# Patient Record
Sex: Male | Born: 1965
Health system: Southern US, Community
[De-identification: ages and names within clinical notes are randomized; demographics above are authoritative.]

## PROBLEM LIST (undated history)

## (undated) DIAGNOSIS — I10 Essential (primary) hypertension: Secondary | ICD-10-CM

## (undated) DIAGNOSIS — K6289 Other specified diseases of anus and rectum: Secondary | ICD-10-CM

## (undated) DIAGNOSIS — G4730 Sleep apnea, unspecified: Secondary | ICD-10-CM

## (undated) DIAGNOSIS — N400 Enlarged prostate without lower urinary tract symptoms: Secondary | ICD-10-CM

## (undated) DIAGNOSIS — K921 Melena: Secondary | ICD-10-CM

## (undated) DIAGNOSIS — M1990 Unspecified osteoarthritis, unspecified site: Secondary | ICD-10-CM

## (undated) DIAGNOSIS — K625 Hemorrhage of anus and rectum: Secondary | ICD-10-CM

## (undated) DIAGNOSIS — H538 Other visual disturbances: Secondary | ICD-10-CM

## (undated) DIAGNOSIS — R39198 Other difficulties with micturition: Secondary | ICD-10-CM

## (undated) DIAGNOSIS — E663 Overweight: Secondary | ICD-10-CM

## (undated) DIAGNOSIS — E785 Hyperlipidemia, unspecified: Secondary | ICD-10-CM

## (undated) DIAGNOSIS — M549 Dorsalgia, unspecified: Secondary | ICD-10-CM

## (undated) DIAGNOSIS — R519 Headache, unspecified: Secondary | ICD-10-CM

## (undated) DIAGNOSIS — R51 Headache: Secondary | ICD-10-CM

## (undated) DIAGNOSIS — K469 Unspecified abdominal hernia without obstruction or gangrene: Secondary | ICD-10-CM

## (undated) DIAGNOSIS — G8929 Other chronic pain: Secondary | ICD-10-CM

## (undated) HISTORY — DX: Other visual disturbances: H53.8

## (undated) HISTORY — DX: Unspecified abdominal hernia without obstruction or gangrene: K46.9

## (undated) HISTORY — PX: HERNIA REPAIR: SHX51

## (undated) HISTORY — DX: Headache, unspecified: R51.9

## (undated) HISTORY — DX: Overweight: E66.3

## (undated) HISTORY — PX: BACK SURGERY: SHX140

## (undated) HISTORY — DX: Hyperlipidemia, unspecified: E78.5

## (undated) HISTORY — DX: Other specified diseases of anus and rectum: K62.89

## (undated) HISTORY — DX: Headache: R51

## (undated) HISTORY — DX: Other chronic pain: G89.29

## (undated) HISTORY — DX: Melena: K92.1

## (undated) HISTORY — DX: Essential (primary) hypertension: I10

## (undated) HISTORY — DX: Dorsalgia, unspecified: M54.9

## (undated) HISTORY — DX: Unspecified osteoarthritis, unspecified site: M19.90

## (undated) HISTORY — DX: Other difficulties with micturition: R39.198

## (undated) HISTORY — DX: Hemorrhage of anus and rectum: K62.5

---

## 2001-03-25 ENCOUNTER — Encounter: Payer: Self-pay | Admitting: Emergency Medicine

## 2001-03-25 ENCOUNTER — Emergency Department (HOSPITAL_COMMUNITY): Admission: EM | Admit: 2001-03-25 | Discharge: 2001-03-25 | Payer: Self-pay | Admitting: Emergency Medicine

## 2006-11-20 ENCOUNTER — Ambulatory Visit (HOSPITAL_BASED_OUTPATIENT_CLINIC_OR_DEPARTMENT_OTHER): Admission: RE | Admit: 2006-11-20 | Discharge: 2006-11-21 | Payer: Self-pay | Admitting: General Surgery

## 2009-11-17 ENCOUNTER — Emergency Department (HOSPITAL_BASED_OUTPATIENT_CLINIC_OR_DEPARTMENT_OTHER): Admission: EM | Admit: 2009-11-17 | Discharge: 2009-11-17 | Payer: Self-pay | Admitting: Emergency Medicine

## 2010-09-26 ENCOUNTER — Encounter: Payer: Self-pay | Admitting: Emergency Medicine

## 2010-11-29 LAB — COMPREHENSIVE METABOLIC PANEL
ALT: 23 U/L (ref 0–53)
AST: 28 U/L (ref 0–37)
Albumin: 4.5 g/dL (ref 3.5–5.2)
Alkaline Phosphatase: 118 U/L — ABNORMAL HIGH (ref 39–117)
BUN: 10 mg/dL (ref 6–23)
CO2: 29 mEq/L (ref 19–32)
Calcium: 9.2 mg/dL (ref 8.4–10.5)
Chloride: 106 mEq/L (ref 96–112)
Creatinine, Ser: 0.9 mg/dL (ref 0.4–1.5)
GFR calc Af Amer: >60 mL/min (ref >60)
GFR calc non Af Amer: >60 mL/min (ref >60)
Glucose, Bld: 88 mg/dL (ref 70–99)
Potassium: 3.8 mEq/L (ref 3.5–5.1)
Sodium: 148 mEq/L — ABNORMAL HIGH (ref 135–145)
Total Bilirubin: 0.6 mg/dL (ref 0.3–1.2)
Total Protein: 9 g/dL — ABNORMAL HIGH (ref 6.0–8.3)

## 2010-11-29 LAB — URINE MICROSCOPIC-ADD ON

## 2010-11-29 LAB — URINALYSIS, ROUTINE W REFLEX MICROSCOPIC
Bilirubin Urine: NEGATIVE
Glucose, UA: NEGATIVE mg/dL
Ketones, ur: NEGATIVE mg/dL
Leukocytes, UA: NEGATIVE
Nitrite: NEGATIVE
Protein, ur: NEGATIVE mg/dL
Specific Gravity, Urine: 1.036 — ABNORMAL HIGH (ref 1.005–1.030)
Urobilinogen, UA: 0.2 mg/dL (ref 0.0–1.0)
pH: 5.5 (ref 5.0–8.0)

## 2010-11-29 LAB — DIFFERENTIAL
Basophils Absolute: 0.1 10*3/uL (ref 0.0–0.1)
Basophils Relative: 1 % (ref 0–1)
Eosinophils Absolute: 0.3 10*3/uL (ref 0.0–0.7)
Eosinophils Relative: 2 % (ref 0–5)
Lymphocytes Relative: 32 % (ref 12–46)
Lymphs Abs: 4.9 10*3/uL — ABNORMAL HIGH (ref 0.7–4.0)
Monocytes Absolute: 1.5 10*3/uL — ABNORMAL HIGH (ref 0.1–1.0)
Monocytes Relative: 10 % (ref 3–12)
Neutro Abs: 8.8 10*3/uL — ABNORMAL HIGH (ref 1.7–7.7)
Neutrophils Relative %: 56 % (ref 43–77)

## 2010-11-29 LAB — CBC
HCT: 47.4 % (ref 39.0–52.0)
Hemoglobin: 16.2 g/dL (ref 13.0–17.0)
MCHC: 34.2 g/dL (ref 30.0–36.0)
MCV: 87.5 fL (ref 78.0–100.0)
Platelets: 264 10*3/uL (ref 150–400)
RBC: 5.42 MIL/uL (ref 4.22–5.81)
RDW: 12.5 % (ref 11.5–15.5)
WBC: 15.6 10*3/uL — ABNORMAL HIGH (ref 4.0–10.5)

## 2010-11-29 LAB — LIPASE, BLOOD: Lipase: 46 U/L (ref 23–300)

## 2010-11-29 LAB — SEDIMENTATION RATE: Sed Rate: 8 mm/hr (ref 0–16)

## 2011-01-21 NOTE — Op Note (Signed)
NAMESHADD, DUNSTAN NO.:  [id number]   MEDICAL RECORD NO.:  [id number]          PATIENT TYPE:  AMB   LOCATION:  DSC                          FACILITY:  [facility]   PHYSICIAN:  Cherylynn Ridges, M.D.    DATE OF BIRTH:  01-05-1966   DATE OF PROCEDURE:  11/21/2006  DATE OF DISCHARGE:                               OPERATIVE REPORT   PREOP DIAGNOSIS:  Epigastric double ventral hernia.   POSTOP DIAGNOSIS:  Epigastric double ventral hernia.   PROCEDURE:  Repair of double ventral hernia with mesh.   SURGEON:  Cherylynn Ridges, M.D.   ANESTHESIA:  General endotracheal.   ESTIMATED BLOOD LOSS:  Less than 20 mL.   COMPLICATIONS:  None.   CONDITION:  Stable.   FINDINGS:  The patient had incarcerated ventral hernia between the  umbilicus and the epigastric area with omentum caught in it.   OPERATION:  The patient was taken to the operating room, placed on the  table in the supine position.  After an adequate endotracheal anesthetic  was administered; he was prepped and draped in the usual sterile manner  exposing the midline.   An 8-10 cm long midline incision was made above the umbilicus down to  the subcutaneous tissue.  We used electrocautery dissecting down to the  hernia sac which was then dissected down to its fascial attachments  circumferentially.  The fascial opening measured approximately 6 x 4 cm  in size and we excised the sac.  The omentum was allowed to fall back  into the peritoneal cavity.  We then repaired the fascial defect using  interrupted simple stitches of #1 Novofil.  We reinforced this with an  oval piece of mesh measuring 5 x 4 cm in size attaching it on top of the  previous repair using interrupted #1 Novofil sutures.  We irrigated and  soaked the mesh in antibiotic solution, then we closed in two layers; a  deep subcu layer of 3-0 Vicryl; then the skin was closed using stainless  steel staples.  All counts were correct.  Marcaine was injected  into the  wound approximately 10 mL.      Cherylynn Ridges, M.D.  Electronically Signed    JOW/MEDQ  D:  11/20/2006  T:  11/21/2006  Job:  [id number]

## 2011-03-28 ENCOUNTER — Encounter (INDEPENDENT_AMBULATORY_CARE_PROVIDER_SITE_OTHER): Payer: Self-pay

## 2011-03-29 ENCOUNTER — Ambulatory Visit (INDEPENDENT_AMBULATORY_CARE_PROVIDER_SITE_OTHER): Payer: Managed Care, Other (non HMO) | Admitting: General Surgery

## 2011-03-29 ENCOUNTER — Encounter (INDEPENDENT_AMBULATORY_CARE_PROVIDER_SITE_OTHER): Payer: Self-pay | Admitting: General Surgery

## 2011-03-29 DIAGNOSIS — K649 Unspecified hemorrhoids: Secondary | ICD-10-CM | POA: Insufficient documentation

## 2011-03-29 DIAGNOSIS — K648 Other hemorrhoids: Secondary | ICD-10-CM

## 2011-03-29 MED ORDER — HYDROCORTISONE ACETATE 25 MG RE SUPP: 25.0000 mg | Freq: Two times a day (BID) | RECTAL | Status: DC

## 2011-03-29 MED ORDER — HYDROCORTISONE ACETATE 25 MG RE SUPP: 25.0000 mg | Freq: Two times a day (BID) | RECTAL | Status: AC

## 2011-03-29 NOTE — Progress Notes (Signed)
Jared Lopez is a 45 y.Lopez. male.    Chief Complaint  Patient presents with  . Other    new prob- perirectal mass    HPI HPI The patient is status post ventral hernia repair doing well. He comes in now complaining of some perianal and perirectal pain and discomfort along with the mass.  Past Medical History  Diagnosis Date  . Hypertension   . Over weight   . Chronic back pain   . Hyperlipidemia   . Hernia   . Abdominal pain   . Difficulty urinating   . Arthritis   . Blurred vision     Past Surgical History  Procedure Date  . Hernia repair     Family History  Problem Relation Age of Onset  . Heart disease Mother   . Hypertension Mother     Social History History  Substance Use Topics  . Smoking status: Current Some Day Smoker    Types: Cigars  . Smokeless tobacco: Not on file  . Alcohol Use: 0.0 oz/week    1-2 Cans of beer per week    No Known Allergies  Current Outpatient Prescriptions  Medication Sig Dispense Refill  . HYDROcodone-acetaminophen (VICODIN) 5-500 MG per tablet Ad lib.      . metroNIDAZOLE (FLAGYL) 500 MG tablet Daily.      . verapamil (CALAN-SR) 240 MG CR tablet Take 240 mg by mouth daily.        . cyclobenzaprine (FLEXERIL) 10 MG tablet Take 10 mg by mouth at bedtime as needed.        [name] Take 100 mg by mouth 2 (two) times daily.          Review of Systems Review of Systems  Gastrointestinal: Positive for blood in stool (On tissue).    Physical Exam Physical Exam  Genitourinary: Rectal exam shows external hemorrhoid (700 position with internal) and internal hemorrhoid (700 position).        Blood pressure 178/104, pulse 72, temperature 97.3 F (36.3 C), height 5\' 11"  (1.803 m), weight 263 lb 6.4 oz (119.477 kg).  Assessment/Plan: On examination the patient has internal and external hemorrhoids. No acute surgical treatment as necessary.  Plan: Treat the patient with Anusol-HC suppositories and sitz baths.  OC him back in 3 weeks Jared Lopez,Jared Lopez 03/29/2011, 12:09 PM

## 2011-03-29 NOTE — Progress Notes (Signed)
Addended by: Cherylynn Ridges III on: 03/29/2011 12:31 PM   Modules accepted: Orders

## 2011-04-19 ENCOUNTER — Encounter (INDEPENDENT_AMBULATORY_CARE_PROVIDER_SITE_OTHER): Payer: Managed Care, Other (non HMO) | Admitting: General Surgery

## 2011-05-10 ENCOUNTER — Encounter (INDEPENDENT_AMBULATORY_CARE_PROVIDER_SITE_OTHER): Payer: Self-pay | Admitting: General Surgery

## 2011-05-10 ENCOUNTER — Ambulatory Visit (INDEPENDENT_AMBULATORY_CARE_PROVIDER_SITE_OTHER): Payer: Managed Care, Other (non HMO) | Admitting: General Surgery

## 2011-05-10 DIAGNOSIS — K649 Unspecified hemorrhoids: Secondary | ICD-10-CM

## 2011-05-10 NOTE — Progress Notes (Signed)
HPI The patient comes in today with continuing symptoms of perirectal pain and bleeding  PE On examination today he has a firm external hemorrhoid at approximately the one to 2:00 position with 12:00 being directly posterior. Internally he also has an internal hemorrhoid associated with that same column of vessels.  Studiy review There are no studies to review.  Assessment The patient has continuing hemorrhoids and hemorrhoidal symptoms. This after undergoing several weeks of soft suppository treatment.  The patient is due to have back surgery tomorrow  Plan I will assess the patient for an examination under anesthesia in the perirectal and perianal area along with hemorrhoidectomy after he has recovered from his back surgery. I will see the patient again in the office to discuss with him the details of surgery in about 3 months.

## 2011-08-23 ENCOUNTER — Ambulatory Visit (INDEPENDENT_AMBULATORY_CARE_PROVIDER_SITE_OTHER): Payer: Managed Care, Other (non HMO) | Admitting: General Surgery

## 2011-09-01 ENCOUNTER — Encounter (INDEPENDENT_AMBULATORY_CARE_PROVIDER_SITE_OTHER): Payer: Self-pay | Admitting: General Surgery

## 2011-09-06 HISTORY — PX: CARDIAC CATHETERIZATION: SHX172

## 2012-06-01 ENCOUNTER — Encounter (HOSPITAL_COMMUNITY): Payer: Self-pay

## 2012-06-12 ENCOUNTER — Encounter (HOSPITAL_COMMUNITY)
Admission: RE | Disposition: A | Discharge status: Home / Self Care | Payer: Self-pay | Source: Ambulatory Visit | Attending: Cardiology

## 2012-06-12 ENCOUNTER — Ambulatory Visit (HOSPITAL_COMMUNITY)
Admission: RE | Admit: 2012-06-12 | Discharge: 2012-06-12 | Disposition: A | Discharge status: Home / Self Care | Payer: Managed Care, Other (non HMO) | Source: Ambulatory Visit | Attending: Cardiology | Admitting: Cardiology

## 2012-06-12 DIAGNOSIS — I10 Essential (primary) hypertension: Secondary | ICD-10-CM | POA: Insufficient documentation

## 2012-06-12 DIAGNOSIS — R079 Chest pain, unspecified: Secondary | ICD-10-CM | POA: Insufficient documentation

## 2012-06-12 HISTORY — PX: LEFT HEART CATHETERIZATION WITH CORONARY ANGIOGRAM: SHX5451

## 2012-06-12 SURGERY — LEFT HEART CATHETERIZATION WITH CORONARY ANGIOGRAM
Anesthesia: LOCAL

## 2012-06-12 MED ORDER — HEPARIN (PORCINE) IN NACL 2-0.9 UNIT/ML-% IJ SOLN
INTRAMUSCULAR | Status: AC
  Filled 2012-06-12: qty 1500

## 2012-06-12 MED ORDER — HYDROMORPHONE HCL PF 2 MG/ML IJ SOLN
INTRAMUSCULAR | Status: AC
  Filled 2012-06-12: qty 1

## 2012-06-12 MED ORDER — SODIUM CHLORIDE 0.9 % IV SOLN
INTRAVENOUS | Status: DC
  Administered 2012-06-12: 06:00:00 via INTRAVENOUS

## 2012-06-12 MED ORDER — SODIUM CHLORIDE 0.9 % IV SOLN: 1.0000 mL/kg/h | INTRAVENOUS | Status: DC

## 2012-06-12 MED ORDER — LIDOCAINE HCL (PF) 1 % IJ SOLN
INTRAMUSCULAR | Status: AC
  Filled 2012-06-12: qty 30

## 2012-06-12 MED ORDER — ACETAMINOPHEN 325 MG PO TABS: 650.0000 mg | ORAL_TABLET | ORAL | Status: DC | PRN

## 2012-06-12 MED ORDER — ONDANSETRON HCL 4 MG/2ML IJ SOLN: 4.0000 mg | Freq: Four times a day (QID) | INTRAMUSCULAR | Status: DC | PRN

## 2012-06-12 MED ORDER — VERAPAMIL HCL 2.5 MG/ML IV SOLN
INTRAVENOUS | Status: AC
  Filled 2012-06-12: qty 2

## 2012-06-12 MED ORDER — MIDAZOLAM HCL 2 MG/2ML IJ SOLN
INTRAMUSCULAR | Status: AC
  Filled 2012-06-12: qty 2

## 2012-06-12 MED ORDER — NITROGLYCERIN 0.2 MG/ML ON CALL CATH LAB
INTRAVENOUS | Status: AC
  Filled 2012-06-12: qty 1

## 2012-06-12 NOTE — CV Procedure (Signed)
Procedure performed:  Left heart catheterization including hemodynamic monitoring of the left ventricle, LV gram, selective right and left coronary arteriography.  Indication patient is a 46 year-old man with history of hypertension,  who presents with chest pain. Patient has  had non invasive testing which was abnormal revealing inferior ischemia.  Hence is brought to the cardiac catheterization lab to evaluate her coronary anatomy for definitive diagnosis of CAD.  Hemodynamic data:  Left ventricular pressure was 111/4 with LVEDP of 12 mm mercury. Aortic pressure was 105/75 with a mean of 89 mm mercury. There was no pressure gradient across the aortic valve  Left ventricle: Performed in the RAO projection revealed LVEF of 60%. There was No MR. No Wall motion abnormality.  Right coronary artery: The vessel is smooth, normal,  Dominant. It is a very large caliber vessel. The PD and PL bifurcation is in the proximal segment.  Left main coronary artery is large and normal.  Circumflex coronary artery: A large vessel giving origin to a large obtuse marginal 1.   LAD:  Very large caliber vessel . LAD gives origin to a large diagonal-1.  LAD Normal  Technique: Under sterile precautions using a 6 French right radial  arterial access, a 6 French sheath was introduced into the right radial artery. A 5 [place] Tig 4 catheter was advanced into the ascending aorta selective  right coronary artery cannulated and angiography was performed in multiple views. The catheter was then pulled out of the body over the J-wire, a 5 [place] JL 3.5 plastic catheter was utilized to engage left main coronary artery. Angiography was performed in multiple views. The catheter was pulled back Out of the body over exchange length J-wire.  TIG 4 Catheter was used to perform LV gram which was performed in LAO projection Catheter exchanged out of the body over J-Wire. NO immediate complications noted. Patient tolerated the  procedure well.   Rec: Medical therapy with aggressive risk factor reduction.   Disposition: Will be discharged home today with outpatient follow up.

## 2012-06-12 NOTE — Interval H&P Note (Signed)
History and Physical Interval Note:  06/12/2012 7:45 AM  Jared Lopez  has presented today for surgery, with the diagnosis of cp/hp  The various methods of treatment have been discussed with the patient and family. After consideration of risks, benefits and other options for treatment, the patient has consented to  Procedure(s) (LRB) with comments: LEFT HEART CATHETERIZATION WITH CORONARY ANGIOGRAM (N/A) and possible angioplasty as a surgical intervention .  The patient's history has been reviewed, patient examined, no change in status, stable for surgery.  I have reviewed the patient's chart and labs.  Questions were answered to the patient's satisfaction.     Pamella Pert

## 2012-06-12 NOTE — H&P (Signed)
  Please see paper chart  

## 2014-08-14 ENCOUNTER — Encounter (HOSPITAL_COMMUNITY): Payer: Self-pay | Admitting: Cardiology

## 2016-07-25 ENCOUNTER — Encounter (HOSPITAL_BASED_OUTPATIENT_CLINIC_OR_DEPARTMENT_OTHER): Payer: Self-pay | Admitting: *Deleted

## 2016-07-25 ENCOUNTER — Emergency Department (HOSPITAL_BASED_OUTPATIENT_CLINIC_OR_DEPARTMENT_OTHER): Payer: Managed Care, Other (non HMO)

## 2016-07-25 ENCOUNTER — Emergency Department (HOSPITAL_BASED_OUTPATIENT_CLINIC_OR_DEPARTMENT_OTHER)
Admission: EM | Admit: 2016-07-25 | Discharge: 2016-07-25 | Disposition: A | Discharge status: Home / Self Care | Payer: Managed Care, Other (non HMO) | Attending: Emergency Medicine | Admitting: Emergency Medicine

## 2016-07-25 DIAGNOSIS — F17290 Nicotine dependence, other tobacco product, uncomplicated: Secondary | ICD-10-CM | POA: Insufficient documentation

## 2016-07-25 DIAGNOSIS — I10 Essential (primary) hypertension: Secondary | ICD-10-CM | POA: Diagnosis not present

## 2016-07-25 DIAGNOSIS — R21 Rash and other nonspecific skin eruption: Secondary | ICD-10-CM | POA: Diagnosis present

## 2016-07-25 DIAGNOSIS — Z79899 Other long term (current) drug therapy: Secondary | ICD-10-CM | POA: Insufficient documentation

## 2016-07-25 DIAGNOSIS — B019 Varicella without complication: Secondary | ICD-10-CM | POA: Insufficient documentation

## 2016-07-25 LAB — COMPREHENSIVE METABOLIC PANEL
ALT: 29 U/L (ref 17–63)
AST: 24 U/L (ref 15–41)
Albumin: 4.2 g/dL (ref 3.5–5.0)
Alkaline Phosphatase: 97 U/L (ref 38–126)
Anion gap: 10 (ref 5–15)
BUN: 12 mg/dL (ref 6–20)
CO2: 24 mmol/L (ref 22–32)
Calcium: 9.1 mg/dL (ref 8.9–10.3)
Chloride: 101 mmol/L (ref 101–111)
Creatinine, Ser: 1.06 mg/dL (ref 0.61–1.24)
GFR calc Af Amer: >60 mL/min (ref >60)
GFR calc non Af Amer: >60 mL/min (ref >60)
Glucose, Bld: 128 mg/dL — ABNORMAL HIGH (ref 65–99)
Potassium: 3.5 mmol/L (ref 3.5–5.1)
Sodium: 135 mmol/L (ref 135–145)
Total Bilirubin: 0.6 mg/dL (ref 0.3–1.2)
Total Protein: 8.7 g/dL — ABNORMAL HIGH (ref 6.5–8.1)

## 2016-07-25 LAB — CBC WITH DIFFERENTIAL/PLATELET
Band Neutrophils: 1 %
Basophils Absolute: 0.2 10*3/uL — ABNORMAL HIGH (ref 0.0–0.1)
Basophils Relative: 1 %
Eosinophils Absolute: 0.2 10*3/uL (ref 0.0–0.7)
Eosinophils Relative: 1 %
HCT: 46.2 % (ref 39.0–52.0)
Hemoglobin: 16 g/dL (ref 13.0–17.0)
Lymphocytes Relative: 12 %
Lymphs Abs: 2.1 10*3/uL (ref 0.7–4.0)
MCH: 29.1 pg (ref 26.0–34.0)
MCHC: 34.6 g/dL (ref 30.0–36.0)
MCV: 84 fL (ref 78.0–100.0)
Monocytes Absolute: 1.4 10*3/uL — ABNORMAL HIGH (ref 0.1–1.0)
Monocytes Relative: 8 %
Neutro Abs: 13.7 10*3/uL — ABNORMAL HIGH (ref 1.7–7.7)
Neutrophils Relative %: 77 %
Platelets: 297 10*3/uL (ref 150–400)
RBC: 5.5 MIL/uL (ref 4.22–5.81)
RDW: 13.8 % (ref 11.5–15.5)
WBC: 17.6 10*3/uL — ABNORMAL HIGH (ref 4.0–10.5)

## 2016-07-25 LAB — TROPONIN I: Troponin I: <0.03 ng/mL (ref <0.03)

## 2016-07-25 MED ORDER — FAMOTIDINE 20 MG PO TABS: 20.0000 mg | ORAL_TABLET | Freq: Two times a day (BID) | ORAL | 0 refills | Status: DC

## 2016-07-25 MED ORDER — VALACYCLOVIR HCL 1 G PO TABS: 1000.0000 mg | ORAL_TABLET | Freq: Three times a day (TID) | ORAL | 0 refills | Status: AC

## 2016-07-25 MED ORDER — HYDROXYZINE HCL 25 MG PO TABS: 25.0000 mg | ORAL_TABLET | Freq: Four times a day (QID) | ORAL | 0 refills | Status: DC | PRN

## 2016-07-25 NOTE — Discharge Instructions (Addendum)
Take the Valtrex as prescribed. The rest of your workup was reassuring. Your ultrasound was negative. Follow up with your primary care provider. Return to the ER for new or worsening symptoms.

## 2016-07-25 NOTE — ED Triage Notes (Signed)
Blistery rash all over his body x 4 days.

## 2016-07-25 NOTE — ED Provider Notes (Signed)
[facility]   CSN: [id number] Arrival date & time: 07/25/16  1337     History   Chief Complaint Chief Complaint  Patient presents with  . Rash    HPI Jared Lopez is a 50 y.o. male who presents to the emergency department with a 3-day history of pruritic and painful maculopapular rash. The rash began in his left axilla and spread to both upper extremities followed by the torso and back and finally his genital region and posterior thighs. He does have some of the lesions on his palms which have been more bothersome as he can feel them when the flexes his fingers. Some of the lesions are slightly pruritic while other vesicular lesions are painful. He otherwise "feels fine". He did not have "chicken pox" as a child. He also endorses persistent heartburn and unilateral left lower extremity swelling and calf pain for the past few weeks. This is a new problem for him. He denies any history of ill contacts with similar rash,  fever, chills, nausea/vomiting, chest pain, shortness of breath, history of PE/DVT, prolonged immobilization, or recent surgery.  HPI  Past Medical History:  Diagnosis Date  . Abdominal pain   . Arthritis   . Blood in stool   . Blurred vision   . Chronic back pain   . Constipation   . Difficulty urinating   . Generalized headaches    high blood pressure onset  . Hernia   . Hyperlipidemia   . Hypertension   . Mass of perirectal soft tissue   . Over weight   . Rectal bleeding     Patient Active Problem List   Diagnosis Date Noted  . Hemorrhoids 05/10/2011  . Hemorrhoids 03/29/2011  . Internal hemorrhoids 03/29/2011    Past Surgical History:  Procedure Laterality Date  . HERNIA REPAIR    . LEFT HEART CATHETERIZATION WITH CORONARY ANGIOGRAM N/A 06/12/2012   Procedure: LEFT HEART CATHETERIZATION WITH CORONARY ANGIOGRAM;  Surgeon: Pamella Pert, MD;  Location: [facility] CATH LAB;  Service: Cardiovascular;  Laterality: N/A;        Home Medications    Prior to Admission medications   Medication Sig Start Date End Date Taking? Authorizing Provider  ISOSORBIDE DINITRATE PO Take by mouth.   Yes Historical Provider, MD  predniSONE (DELTASONE) 20 MG tablet Take 20 mg by mouth daily with breakfast.   Yes Historical Provider, MD  Tamsulosin HCl (FLOMAX) 0.4 MG CAPS 0.4 mg daily after supper. 03/29/11  Yes Historical Provider, MD  triamcinolone ointment (KENALOG) 0.1 % Apply 1 application topically 2 (two) times daily.   Yes Historical Provider, MD  verapamil (CALAN-SR) 240 MG CR tablet Take 240 mg by mouth daily.     Yes Historical Provider, MD  hydrochlorothiazide (HYDRODIURIL) 25 MG tablet Take 25 mg by mouth daily.    Historical Provider, MD  HYDROcodone-acetaminophen (VICODIN) 5-500 MG per tablet Take 1 tablet by mouth daily as needed.  03/15/11   Historical Provider, MD  pravastatin (PRAVACHOL) 40 MG tablet Take 40 mg by mouth daily.    Historical Provider, MD  valACYclovir (VALTREX) 1000 MG tablet Take 1 tablet (1,000 mg total) by mouth 3 (three) times daily. 07/25/16 08/01/16  Georgiana Shore, PA-C    Family History Family History  Problem Relation Age of Onset  . Heart disease Mother   . Hypertension Mother     Social History Social History  Substance Use Topics  . Smoking status: Current Some Day Smoker  Types: Cigars  . Smokeless tobacco: Never Used  . Alcohol use 0.0 oz/week    1 - 2 Cans of beer per week     Allergies   Patient has no known allergies.   Review of Systems Review of Systems  Constitutional: Negative for activity change, appetite change, chills, fatigue, fever and unexpected weight change.       Reports one episode of night sweats, awakening with his shirt soaked through on Sunday  HENT: Negative for congestion, ear discharge, ear pain, postnasal drip, rhinorrhea, sinus pain, sore throat, trouble swallowing and voice change.   Eyes: Negative for photophobia, pain,  discharge, redness, itching and visual disturbance.  Respiratory: Negative for apnea, cough, choking, chest tightness, shortness of breath, wheezing and stridor.   Cardiovascular: Negative for chest pain, palpitations and leg swelling.  Gastrointestinal: Negative for abdominal distention, abdominal pain, blood in stool, diarrhea, nausea and vomiting.       Patient reports waking up with heartburn  Genitourinary: Positive for genital sores. Negative for difficulty urinating, dysuria, flank pain and hematuria.       Patient reports some of the lesions now on his genitals  Musculoskeletal: Negative for back pain, gait problem, joint swelling, neck pain and neck stiffness.  Skin: Positive for rash. Negative for color change, pallor and wound.       Patient reports a widespread rash that began at his left axilla and spread to his upper extremities, torso, back, genitals and posterior thighs. Some lesions are vesicular and painful while others are macular papular and pruritic.  Neurological: Negative for dizziness, tremors, seizures, syncope, weakness, light-headedness, numbness and headaches.     Physical Exam Updated Vital Signs BP 130/70 (BP Location: Right Arm)   Pulse 76   Temp 98.1 F (36.7 C) (Oral)   Resp 16   Ht 5\' 11"  (1.803 m)   Wt 127.9 kg   SpO2 98%   BMI 39.33 kg/m   Physical Exam  Constitutional: He is oriented to person, place, and time. He appears well-developed and well-nourished. No distress.  Patient is nontoxic appearing sitting comfortably in chair  HENT:  Head: Normocephalic and atraumatic.  Eyes: Right eye exhibits no discharge. Left eye exhibits no discharge.  Neck: Normal range of motion. Neck supple. No tracheal deviation present.  Cardiovascular: Normal rate, regular rhythm and intact distal pulses.  Exam reveals no gallop.   No murmur heard. Pulmonary/Chest: Effort normal and breath sounds normal. No stridor. No respiratory distress. He has no wheezes. He  has no rales. He exhibits no tenderness.  Abdominal: Soft. Bowel sounds are normal. He exhibits no distension. There is no tenderness.  Musculoskeletal: He exhibits tenderness. He exhibits no deformity.  Tender to palpation of the gastrocnemius muscles. 1+ pitting edema in the left lower extremity. Skin is warm to touch bilaterally with good dorsalis pedis pulses. Patient does not have sensory deficits in lower extremities bilaterally.  Neurological: He is alert and oriented to person, place, and time. No sensory deficit. He exhibits normal muscle tone.  Skin: Skin is warm and dry. Rash noted. He is not diaphoretic. No pallor.  Patient has a widespread maculo-papular vesicular rash in different stages of healing that does include the palms. The rash is located on his axilla and both upper extremities, trunk, back, posterior thighs, groin and genitals.  Psychiatric: He has a normal mood and affect. His behavior is normal.  Nursing note and vitals reviewed.    ED Treatments / Results  Labs (all  labs ordered are listed, but only abnormal results are displayed) Labs Reviewed  COMPREHENSIVE METABOLIC PANEL - Abnormal; Notable for the following:       Result Value   Glucose, Bld 128 (*)    Total Protein 8.7 (*)    All other components within normal limits  CBC WITH DIFFERENTIAL/PLATELET - Abnormal; Notable for the following:    WBC 17.6 (*)    Neutro Abs 13.7 (*)    Monocytes Absolute 1.4 (*)    Basophils Absolute 0.2 (*)    All other components within normal limits  HIV ANTIBODY (ROUTINE TESTING)  RPR  TROPONIN I    EKG  EKG Interpretation None       Radiology Dg Chest 2 View  Result Date: 07/25/2016 CLINICAL DATA:  Chest discomfort, heart burn EXAM: CHEST  2 VIEW COMPARISON:  None. FINDINGS: The heart size and mediastinal contours are within normal limits. Both lungs are clear. The visualized skeletal structures are unremarkable. IMPRESSION: No active cardiopulmonary disease.  Electronically Signed   By: Natasha MeadLiviu  Pop M.D.   On: 07/25/2016 16:09   [place] Venous Img Lower Unilateral Left  Result Date: 07/25/2016 CLINICAL DATA:  Left lower extremity swelling for 1 week EXAM: LEFT LOWER EXTREMITY VENOUS DUPLEX ULTRASOUND TECHNIQUE: Doppler venous assessment of the left lower extremity deep venous system was performed, including characterization of spectral flow, compressibility, and phasicity. COMPARISON:  None. FINDINGS: There is complete compressibility of the left common femoral, femoral, and popliteal veins. Doppler analysis demonstrates respiratory phasicity and augmentation of flow with calf compression. No obvious superficial vein or calf vein thrombosis. Left popliteal fossa Baker's cyst measures 4.2 x 4.9 x 1.3 cm. IMPRESSION: No evidence of left lower extremity. Left popliteal fossa Baker's cyst. Electronically Signed   By: Jolaine ClickArthur  Hoss M.D.   On: 07/25/2016 17:13    Procedures Procedures (including critical care time)  Medications Ordered in ED Medications - No data to display   Initial Impression / Assessment and Plan / ED Course  I have reviewed the triage vital signs and the nursing notes.  Pertinent labs & imaging results that were available during my care of the patient were reviewed by me and considered in my medical decision making (see chart for details).  Clinical Course    50 y/o male with a 3-day history of a widespread mildly pruritic maculopapular vesicular rash in different stages of healing, including painful fluid-filled vesicles on an erythematous base.  Because of his immunization status, macular papular vesicular lesions in various stages of healing and description of onset of symptoms and course, I have a high suspicion for varicella as the etiology of this presentation. Spoke with Dr. Silverio LayYao about the patient who agrees with assessment.  Although patient does not have risk factors for DVT/PE such as prior DVT/PE, prolonged immobilization, recent  surgery, or concerning symptoms, such as tachycardia, hypoxia, shortness of breath, chest pain. This new onset of unilateral lower extremity swelling and calf pain combined with a vague description of chest discomfort as heartburn prompted me to order a lower extremity ultrasound Doppler, EKG, and chest x-ray.  X-ray was negative for cardiopulmonary process, ultrasound Doppler was negative for DVT but did show a left popliteal cyst "Baker's cyst". This further lowers my suspicion for a PE.  Patient was also seen by supervising PA, Trixie DredgeEmily West, who also examined the patient and agrees with assessment and plan.  Signed off patient to Noelle PennerSerena Sam PA-C at end of shift with RPR/HIV, troponin, EKG pending.  Anticipate discharge home with valacyclovir assuming otherwise negative results.   Final Clinical Impressions(s) / ED Diagnoses   Final diagnoses:  Varicella without complication    New Prescriptions New Prescriptions   VALACYCLOVIR (VALTREX) 1000 MG TABLET    Take 1 tablet (1,000 mg total) by mouth 3 (three) times daily.     Georgiana Shore, PA-C 07/25/16 1749    Charlynne Pander, MD 07/26/16 [phone]

## 2016-07-25 NOTE — ED Provider Notes (Signed)
Care assumed from Antony MaduraJ. Mitchell, PA-C at end of shift. In brief Jared Lopez is an 50 y.o. male presenting with 3 days of diffuse rash that is consistent with chicken pox. He has no history of chicken pox before. Plan is to treat him with course of Valtrex.  During evaluation pt also mentioned intermittent heartburn symptoms as well as left leg pain and swelling. His chest pain is intermittent and nonexertional. Suspicion for ACS is low. Labs thus far unremarkable. At this time troponin is pending. Pt also mentioned atraumatic left leg pain/swelling for several weeks. No risk factors for DVT. Doppler study is pending. Pending negative troponin and doppler plan is to d/c home with PCP follow up.   Physical Exam  BP 130/70 (BP Location: Right Arm)   Pulse 76   Temp 98.1 F (36.7 C) (Oral)   Resp 16   Ht 5\' 11"  (1.803 m)   Wt 127.9 kg   SpO2 98%   BMI 39.33 kg/m   Physical Exam  Constitutional: He is oriented to person, place, and time. No distress.  HENT:  Head: Atraumatic.  Right Ear: External ear normal.  Left Ear: External ear normal.  Nose: Nose normal.  Eyes: Conjunctivae are normal. No scleral icterus.  Cardiovascular: Normal rate and regular rhythm.   Pulmonary/Chest: Effort normal. No respiratory distress.  Abdominal: He exhibits no distension.  Neurological: He is alert and oriented to person, place, and time.  Skin: Skin is warm and dry. He is not diaphoretic.  Vesiculopapular rash across bilateral extremities, torso, and thighs. Some are tender. Some excoriation marks.  Psychiatric: He has a normal mood and affect. His behavior is normal.  Nursing note and vitals reviewed.      ED Course  Procedures Results for orders placed or performed during the hospital encounter of 07/25/16  Comprehensive metabolic panel  Result Value Ref Range   Sodium 135 135 - 145 mmol/L   Potassium 3.5 3.5 - 5.1 mmol/L   Chloride 101 101 - 111 mmol/L   CO2 24 22 - 32 mmol/L   Glucose,  Bld 128 (H) 65 - 99 mg/dL   BUN 12 6 - 20 mg/dL   Creatinine, Ser 4.091.06 0.61 - 1.24 mg/dL   Calcium 9.1 8.9 - 81.110.3 mg/dL   Total Protein 8.7 (H) 6.5 - 8.1 g/dL   Albumin 4.2 3.5 - 5.0 g/dL   AST 24 15 - 41 U/L   ALT 29 17 - 63 U/L   Alkaline Phosphatase 97 38 - 126 U/L   Total Bilirubin 0.6 0.3 - 1.2 mg/dL   GFR calc non Af Amer >60 >60 mL/min   GFR calc Af Amer >60 >60 mL/min   Anion gap 10 5 - 15  CBC with Differential  Result Value Ref Range   WBC 17.6 (H) 4.0 - 10.5 K/uL   RBC 5.50 4.22 - 5.81 MIL/uL   Hemoglobin 16.0 13.0 - 17.0 g/dL   HCT 91.446.2 78.239.0 - 95.652.0 %   MCV 84.0 78.0 - 100.0 fL   MCH 29.1 26.0 - 34.0 pg   MCHC 34.6 30.0 - 36.0 g/dL   RDW 21.313.8 08.611.5 - 57.815.5 %   Platelets 297 150 - 400 K/uL   Neutrophils Relative % 77 %   Lymphocytes Relative 12 %   Monocytes Relative 8 %   Eosinophils Relative 1 %   Basophils Relative 1 %   Band Neutrophils 1 %   Neutro Abs 13.7 (H) 1.7 - 7.7 K/uL  Lymphs Abs 2.1 0.7 - 4.0 K/uL   Monocytes Absolute 1.4 (H) 0.1 - 1.0 K/uL   Eosinophils Absolute 0.2 0.0 - 0.7 K/uL   Basophils Absolute 0.2 (H) 0.0 - 0.1 K/uL   WBC Morphology WHITE COUNT CONFIRMED ON SMEAR    Smear Review LARGE PLATELETS PRESENT   Troponin I  Result Value Ref Range   Troponin I <0.03 <0.03 ng/mL   Dg Chest 2 View  Result Date: 07/25/2016 CLINICAL DATA:  Chest discomfort, heart burn EXAM: CHEST  2 VIEW COMPARISON:  None. FINDINGS: The heart size and mediastinal contours are within normal limits. Both lungs are clear. The visualized skeletal structures are unremarkable. IMPRESSION: No active cardiopulmonary disease. Electronically Signed   By: Natasha MeadLiviu  Pop M.D.   On: 07/25/2016 16:09   [place] Venous Img Lower Unilateral Left  Result Date: 07/25/2016 CLINICAL DATA:  Left lower extremity swelling for 1 week EXAM: LEFT LOWER EXTREMITY VENOUS DUPLEX ULTRASOUND TECHNIQUE: Doppler venous assessment of the left lower extremity deep venous system was performed, including  characterization of spectral flow, compressibility, and phasicity. COMPARISON:  None. FINDINGS: There is complete compressibility of the left common femoral, femoral, and popliteal veins. Doppler analysis demonstrates respiratory phasicity and augmentation of flow with calf compression. No obvious superficial vein or calf vein thrombosis. Left popliteal fossa Baker's cyst measures 4.2 x 4.9 x 1.3 cm. IMPRESSION: No evidence of left lower extremity. Left popliteal fossa Baker's cyst. Electronically Signed   By: Jolaine ClickArthur  Hoss M.D.   On: 07/25/2016 17:13      MDM Doppler negative. Troponin negative. Discussed findings with pt. Rx for valtrex given per prior team. I have also started pt on Pepcid as well as given rx for vistaril to help with itching. Instructed close f/u with PCP. ER return precautions given.       Carlene CoriaSerena Y Rondle Lohse, PA-C 07/26/16 0111    Arby BarretteMarcy Pfeiffer, MD 07/28/16 [phone]

## 2016-07-26 LAB — RPR: RPR Ser Ql: REACTIVE — AB

## 2016-07-26 LAB — RPR, QUANT+TP ABS (REFLEX)
Rapid Plasma Reagin, Quant: 1:1 {titer} — ABNORMAL HIGH
T Pallidum Abs: POSITIVE — AB

## 2016-07-26 LAB — HIV ANTIBODY (ROUTINE TESTING W REFLEX): HIV Screen 4th Generation wRfx: NONREACTIVE

## 2016-07-29 ENCOUNTER — Emergency Department (HOSPITAL_BASED_OUTPATIENT_CLINIC_OR_DEPARTMENT_OTHER)
Admission: EM | Admit: 2016-07-29 | Discharge: 2016-07-29 | Disposition: A | Discharge status: Home / Self Care | Payer: Managed Care, Other (non HMO) | Attending: Emergency Medicine | Admitting: Emergency Medicine

## 2016-07-29 ENCOUNTER — Encounter (HOSPITAL_BASED_OUTPATIENT_CLINIC_OR_DEPARTMENT_OTHER): Payer: Self-pay | Admitting: *Deleted

## 2016-07-29 DIAGNOSIS — I10 Essential (primary) hypertension: Secondary | ICD-10-CM | POA: Diagnosis not present

## 2016-07-29 DIAGNOSIS — Z79899 Other long term (current) drug therapy: Secondary | ICD-10-CM | POA: Diagnosis not present

## 2016-07-29 DIAGNOSIS — F17290 Nicotine dependence, other tobacco product, uncomplicated: Secondary | ICD-10-CM | POA: Insufficient documentation

## 2016-07-29 DIAGNOSIS — A5149 Other secondary syphilitic conditions: Secondary | ICD-10-CM

## 2016-07-29 DIAGNOSIS — A539 Syphilis, unspecified: Secondary | ICD-10-CM | POA: Diagnosis not present

## 2016-07-29 DIAGNOSIS — R21 Rash and other nonspecific skin eruption: Secondary | ICD-10-CM | POA: Diagnosis present

## 2016-07-29 MED ORDER — PENICILLIN G BENZATHINE 1200000 UNIT/2ML IM SUSP
2.4000 10*6.[IU] | Freq: Once | INTRAMUSCULAR | Status: AC
  Administered 2016-07-29: 2.4 10*6.[IU] via INTRAMUSCULAR
  Filled 2016-07-29: qty 4

## 2016-07-29 NOTE — ED Provider Notes (Signed)
[facility] Provider Note   CSN: [id number] Arrival date & time: 07/29/16  1340     History   Chief Complaint Chief Complaint  Patient presents with  . Varicella    HPI Jared Lopez is a 50 y.o. male.  HPI Jared Lopez is a 50 y.o. male presents to emergency department with worsening rash. Patient states he started having rash approximately a week ago. He reports rash started on his hands and feet, now spread to the neck, bilateral axilla, palms and soles of his extremities. He was seen initially by an urgent care and was given steroids. He was seen here 4 days ago, at that time he was thought to have chickenpox. Blood work was obtained at that time as well. Patient was found to have elevated white blood cell count. HIV and RPR was sent. Patient states that his rash is worsening, it is itchy, also painful on his feet. He denies any oral mucosal involvement. He states that he is also having pain and swelling to his joints. He denies any fever. Denies any numbness or weakness in extremities. No headaches. No neck pain or stiffness. No confusion, visual loss, any other concerning symptoms  Past Medical History:  Diagnosis Date  . Abdominal pain   . Arthritis   . Blood in stool   . Blurred vision   . Chronic back pain   . Constipation   . Difficulty urinating   . Generalized headaches    high blood pressure onset  . Hernia   . Hyperlipidemia   . Hypertension   . Mass of perirectal soft tissue   . Over weight   . Rectal bleeding     Patient Active Problem List   Diagnosis Date Noted  . Hemorrhoids 05/10/2011  . Hemorrhoids 03/29/2011  . Internal hemorrhoids 03/29/2011    Past Surgical History:  Procedure Laterality Date  . HERNIA REPAIR    . LEFT HEART CATHETERIZATION WITH CORONARY ANGIOGRAM N/A 06/12/2012   Procedure: LEFT HEART CATHETERIZATION WITH CORONARY ANGIOGRAM;  Surgeon: Pamella PertJagadeesh R Ganji, MD;  [facility];  Service:  Cardiovascular;  Laterality: N/A;       Home Medications    Prior to Admission medications   Medication Sig Start Date End Date Taking? Authorizing Provider  famotidine (PEPCID) 20 MG tablet Take 1 tablet (20 mg total) by mouth 2 (two) times daily. 07/25/16   Ace GinsSerena Y Sam, PA-C  hydrochlorothiazide (HYDRODIURIL) 25 MG tablet Take 25 mg by mouth daily.    Historical Provider, MD  HYDROcodone-acetaminophen (VICODIN) 5-500 MG per tablet Take 1 tablet by mouth daily as needed.  03/15/11   Historical Provider, MD  hydrOXYzine (ATARAX/VISTARIL) 25 MG tablet Take 1 tablet (25 mg total) by mouth every 6 (six) hours as needed. 07/25/16   Ace GinsSerena Y Sam, PA-C  ISOSORBIDE DINITRATE PO Take by mouth.    Historical Provider, MD  pravastatin (PRAVACHOL) 40 MG tablet Take 40 mg by mouth daily.    Historical Provider, MD  predniSONE (DELTASONE) 20 MG tablet Take 20 mg by mouth daily with breakfast.    Historical Provider, MD  Tamsulosin HCl (FLOMAX) 0.4 MG CAPS 0.4 mg daily after supper. 03/29/11   Historical Provider, MD  triamcinolone ointment (KENALOG) 0.1 % Apply 1 application topically 2 (two) times daily.    Historical Provider, MD  valACYclovir (VALTREX) 1000 MG tablet Take 1 tablet (1,000 mg total) by mouth 3 (three) times daily. 07/25/16 08/01/16  Georgiana ShoreJessica B Mitchell, PA-C  verapamil (CALAN-SR) 240 MG CR tablet Take 240 mg by mouth daily.      Historical Provider, MD    Family History Family History  Problem Relation Age of Onset  . Heart disease Mother   . Hypertension Mother     Social History Social History  Substance Use Topics  . Smoking status: Current Some Day Smoker    Types: Cigars  . Smokeless tobacco: Never Used  . Alcohol use 0.0 oz/week    1 - 2 Cans of beer per week     Allergies   Patient has no known allergies.   Review of Systems Review of Systems  Constitutional: Negative for chills and fever.  Respiratory: Negative for cough, chest tightness and shortness of  breath.   Cardiovascular: Negative for chest pain, palpitations and leg swelling.  Gastrointestinal: Negative for abdominal distention, abdominal pain, diarrhea, nausea and vomiting.  Genitourinary: Negative for dysuria, frequency, hematuria and urgency.  Musculoskeletal: Positive for arthralgias, joint swelling and myalgias. Negative for neck pain and neck stiffness.  Skin: Positive for rash.  Allergic/Immunologic: Negative for immunocompromised state.  Neurological: Negative for dizziness, weakness, light-headedness, numbness and headaches.  All other systems reviewed and are negative.    Physical Exam Updated Vital Signs BP 117/77 (BP Location: Left Arm)   Pulse 81   Temp 98.1 F (36.7 C) (Oral)   Resp 22   SpO2 99%   Physical Exam  Constitutional: He appears well-developed and well-nourished. No distress.  HENT:  Head: Normocephalic.  Neck: Normal range of motion. Neck supple.  Cardiovascular: Normal rate, regular rhythm and normal heart sounds.   Pulmonary/Chest: Effort normal and breath sounds normal. No respiratory distress. He has no wheezes. He has no rales.  Abdominal: Soft. There is no tenderness.  Skin: Skin is warm and dry.  Erythematous, raised, papular rash to bilateral hands, wrists, palms of the hands, feet, ankles, soles of the feet, bilateral inguinal areas, bilateral axilla, posterior neck. No lip, or oral involvement.  Nursing note and vitals reviewed.    ED Treatments / Results  Labs (all labs ordered are listed, but only abnormal results are displayed) Labs Reviewed - No data to display  EKG  EKG Interpretation None       Radiology No results found.  Procedures Procedures (including critical care time)  Medications Ordered in ED Medications  penicillin g benzathine (BICILLIN LA) 1200000 UNIT/2ML injection 2.4 Million Units (not administered)     Initial Impression / Assessment and Plan / ED Course  I have reviewed the triage vital  signs and the nursing notes.  Pertinent labs & imaging results that were available during my care of the patient were reviewed by me and considered in my medical decision making (see chart for details).  Clinical Course    Patient is here with worsening rash and diffuse myalgias and arthralgias, patient is under impression he may have chickenpox. Reviewed patient's prior visit. Patient did have blood work done and they did obtain HIV and RPR. Patient HIV is negative. His syphilis workup however showed positive RPR and positive T Pallidum antibodies. This is confirmatory for syphilis. Asked her and to call in to state given it is a reportable disease. Given patient's symptoms, concerning for secondary syphilis. Consulted up-to-date, recommended 2.4 million units of Bicillin LA IM. I discussed results with patient, recommended his wife to be tested as well. We will have patient follow-up with her family doctor to make sure over resolution and not worsening symptoms. All  questions were answered  Vitals:   07/29/16 1508  BP: 117/77  Pulse: 81  Resp: 22  Temp: 98.1 F (36.7 C)  TempSrc: Oral  SpO2: 99%     Final Clinical Impressions(s) / ED Diagnoses   Final diagnoses:  Syphilis, secondary    New Prescriptions New Prescriptions   No medications on file     Jaynie Crumble, PA-C 07/29/16 1625    Jerelyn Scott, MD 07/30/16 [phone]

## 2016-07-29 NOTE — ED Notes (Signed)
ED Provider at bedside. 

## 2016-07-29 NOTE — ED Triage Notes (Signed)
Pt c/o chicken pox DX x 4 days ago , pt c/o severe pain

## 2016-07-29 NOTE — Discharge Instructions (Signed)
Continue tylenol/motrin for pain. Benadryl for itching. Follow up with family doctor. Make sure your wife/sexual partner gets tested.

## 2016-10-03 ENCOUNTER — Other Ambulatory Visit (HOSPITAL_COMMUNITY): Payer: Self-pay | Admitting: Family Medicine

## 2016-10-03 DIAGNOSIS — M7989 Other specified soft tissue disorders: Principal | ICD-10-CM

## 2016-10-03 DIAGNOSIS — M79605 Pain in left leg: Secondary | ICD-10-CM

## 2016-10-04 ENCOUNTER — Ambulatory Visit (HOSPITAL_COMMUNITY)
Admission: RE | Admit: 2016-10-04 | Discharge: 2016-10-04 | Disposition: A | Discharge status: Home / Self Care | Payer: Managed Care, Other (non HMO) | Source: Ambulatory Visit | Attending: Family Medicine | Admitting: Family Medicine

## 2016-10-04 DIAGNOSIS — M79605 Pain in left leg: Secondary | ICD-10-CM

## 2016-10-04 DIAGNOSIS — R2242 Localized swelling, mass and lump, left lower limb: Secondary | ICD-10-CM | POA: Insufficient documentation

## 2016-10-04 DIAGNOSIS — M7989 Other specified soft tissue disorders: Secondary | ICD-10-CM

## 2016-10-04 NOTE — Progress Notes (Signed)
VASCULAR LAB PRELIMINARY  PRELIMINARY  PRELIMINARY  PRELIMINARY  Left lower extremity venous duplex completed.    Preliminary report:  Left:  No evidence of DVT, superficial thrombosis, or Baker's cyst.  Amante Fomby, RVS 10/04/2016, 4:00 PM

## 2016-11-15 ENCOUNTER — Other Ambulatory Visit: Payer: Self-pay | Admitting: Orthopedic Surgery

## 2016-11-17 ENCOUNTER — Ambulatory Visit: Payer: Managed Care, Other (non HMO) | Admitting: Internal Medicine

## 2016-11-21 ENCOUNTER — Encounter (HOSPITAL_BASED_OUTPATIENT_CLINIC_OR_DEPARTMENT_OTHER): Payer: Self-pay | Admitting: *Deleted

## 2016-11-21 NOTE — Progress Notes (Signed)
   11/21/16 1144  OBSTRUCTIVE SLEEP APNEA  Have you ever been diagnosed with sleep apnea through a sleep study? Yes  If yes, do you have and use a CPAP or BPAP machine every night? 1  Do you know the presssure settings on your maching? Yes  Do you snore loudly (loud enough to be heard through closed doors)?  0  Do you often feel tired, fatigued, or sleepy during the daytime (such as falling asleep during driving or talking to someone)? 0  Has anyone observed you stop breathing during your sleep? 0  Do you have, or are you being treated for high blood pressure? 1  BMI more than 35 kg/m2? 1  Age > 50 (1-yes) 0  Neck circumference greater than:Male 16 inches or larger, Male 17inches or larger? 1  Male Gender (Yes=1) 1  Obstructive Sleep Apnea Score 4

## 2016-11-22 ENCOUNTER — Encounter (HOSPITAL_BASED_OUTPATIENT_CLINIC_OR_DEPARTMENT_OTHER)
Admission: RE | Admit: 2016-11-22 | Discharge: 2016-11-22 | Disposition: A | Discharge status: Home / Self Care | Payer: Managed Care, Other (non HMO) | Source: Ambulatory Visit | Attending: Orthopedic Surgery | Admitting: Orthopedic Surgery

## 2016-11-22 DIAGNOSIS — X58XXXA Exposure to other specified factors, initial encounter: Secondary | ICD-10-CM | POA: Diagnosis not present

## 2016-11-22 DIAGNOSIS — R39198 Other difficulties with micturition: Secondary | ICD-10-CM | POA: Diagnosis not present

## 2016-11-22 DIAGNOSIS — G4730 Sleep apnea, unspecified: Secondary | ICD-10-CM | POA: Diagnosis not present

## 2016-11-22 DIAGNOSIS — I10 Essential (primary) hypertension: Secondary | ICD-10-CM | POA: Diagnosis not present

## 2016-11-22 DIAGNOSIS — S83232A Complex tear of medial meniscus, current injury, left knee, initial encounter: Secondary | ICD-10-CM | POA: Diagnosis not present

## 2016-11-22 DIAGNOSIS — Z79899 Other long term (current) drug therapy: Secondary | ICD-10-CM | POA: Diagnosis not present

## 2016-11-22 DIAGNOSIS — S83272A Complex tear of lateral meniscus, current injury, left knee, initial encounter: Secondary | ICD-10-CM | POA: Diagnosis present

## 2016-11-22 DIAGNOSIS — F17290 Nicotine dependence, other tobacco product, uncomplicated: Secondary | ICD-10-CM | POA: Diagnosis not present

## 2016-11-22 DIAGNOSIS — M94262 Chondromalacia, left knee: Secondary | ICD-10-CM | POA: Diagnosis not present

## 2016-11-22 DIAGNOSIS — Z6839 Body mass index (BMI) 39.0-39.9, adult: Secondary | ICD-10-CM | POA: Diagnosis not present

## 2016-11-22 LAB — BASIC METABOLIC PANEL
Anion gap: 7 (ref 5–15)
BUN: 6 mg/dL (ref 6–20)
CO2: 32 mmol/L (ref 22–32)
Calcium: 9.2 mg/dL (ref 8.9–10.3)
Chloride: 101 mmol/L (ref 101–111)
Creatinine, Ser: 1.07 mg/dL (ref 0.61–1.24)
GFR calc Af Amer: >60 mL/min (ref >60)
GFR calc non Af Amer: >60 mL/min (ref >60)
Glucose, Bld: 103 mg/dL — ABNORMAL HIGH (ref 65–99)
Potassium: 3.8 mmol/L (ref 3.5–5.1)
Sodium: 140 mmol/L (ref 135–145)

## 2016-11-22 NOTE — H&P (Signed)
Jared CaroliRonald L Lopez is an 51 y.o. male.    Chief Complaint: Left Knee Pain  HPI: Jared OkaMister Jared Lopez continues have significant pain along the medial side of his left knee.  He's had a 2 prior cortisone injection which helped a great deal, but it is worn off and he is having significant pain with limping.  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  Past Medical History:  Diagnosis Date  . Abdominal pain   . Arthritis   . Blood in stool   . Blurred vision   . Chronic back pain   . Constipation   . Difficulty urinating   . Generalized headaches    high blood pressure onset  . Hernia   . Hyperlipidemia   . Hypertension   . Mass of perirectal soft tissue   . Over weight   . Rectal bleeding   . Sleep apnea    uses CPAP nightly    Past Surgical History:  Procedure Laterality Date  . BACK SURGERY    . CARDIAC CATHETERIZATION  2013   Dr Jacinto HalimGanji  . HERNIA REPAIR    . LEFT HEART CATHETERIZATION WITH CORONARY ANGIOGRAM N/A 06/12/2012   Procedure: LEFT HEART CATHETERIZATION WITH CORONARY ANGIOGRAM;  Surgeon: Pamella PertJagadeesh R Ganji, MD;  [facility];  Service: Cardiovascular;  Laterality: N/A;    Family History  Problem Relation Age of Onset  . Heart disease Mother   . Hypertension Mother    Social History:  reports that he has been smoking Cigars.  He has never used smokeless tobacco. He reports that he drinks alcohol. He reports that he does not use drugs.  Allergies: No Known Allergies  No prescriptions prior to admission.    No results found for this or any previous visit (from the past 48 hour(s)). No results found.  Review of Systems  Constitutional: Negative.   HENT: Positive for hearing loss and tinnitus.   Eyes: Negative.   Respiratory: Negative.   Cardiovascular: Positive for leg swelling.       Enlarged heart, HTN  Gastrointestinal: Positive for heartburn.  Genitourinary:       Weak  stream  Musculoskeletal: Positive for joint pain and myalgias.  Skin: Negative.   Neurological: Negative.   Endo/Heme/Allergies: Negative.   Psychiatric/Behavioral: Negative.     Height 5\' 11"  (1.803 m), weight 131.5 kg (290 lb). Physical Exam  Constitutional: He is oriented to person, place, and time. He appears well-developed and well-nourished.  HENT:  Head: Normocephalic and atraumatic.  Eyes: Pupils are equal, round, and reactive to light.  Neck: Normal range of motion. Neck supple.  Cardiovascular: Intact distal pulses.   Respiratory: Effort normal.  Musculoskeletal: He exhibits tenderness.  Tender along the medial joint line.  The left knee.  1-2+ effusion flexion past 110 exacerbates pain in the knee.    Neurological: He is alert and oriented to person, place, and time.  Skin: Skin is warm and dry.  Psychiatric: He has a normal mood and affect. His behavior is normal. Judgment and thought content normal.     Assessment/Plan Assess: Left knee symptomatic medial meniscal tear, possible lateral meniscal tear, chondromalacia.  Having failed conservative measures over the last few years, including exercise, physical therapy and anti-inflammatory medicines and a couple of cortisone injections.  Plan: Risks and benefits of arthroscopic evaluation treatment of the knee were discussed at length with the patient.  We will  get this set up for him as an outpatient procedure and all see him back at the time of surgical intervention.  He can anticipate being out of work for a few days. since he controls his hours and activities.  If we did this on a Friday.  He should be able to return to work on Monday.  Vickii Volland R, PA-C 11/22/2016, 8:06 AM

## 2016-11-23 ENCOUNTER — Encounter (HOSPITAL_BASED_OUTPATIENT_CLINIC_OR_DEPARTMENT_OTHER)
Admission: RE | Disposition: A | Discharge status: Home / Self Care | Payer: Self-pay | Source: Ambulatory Visit | Attending: Orthopedic Surgery

## 2016-11-23 ENCOUNTER — Ambulatory Visit (HOSPITAL_BASED_OUTPATIENT_CLINIC_OR_DEPARTMENT_OTHER): Payer: Managed Care, Other (non HMO) | Admitting: Anesthesiology

## 2016-11-23 ENCOUNTER — Encounter (HOSPITAL_BASED_OUTPATIENT_CLINIC_OR_DEPARTMENT_OTHER): Payer: Self-pay | Admitting: *Deleted

## 2016-11-23 ENCOUNTER — Ambulatory Visit (HOSPITAL_BASED_OUTPATIENT_CLINIC_OR_DEPARTMENT_OTHER)
Admission: RE | Admit: 2016-11-23 | Discharge: 2016-11-23 | Disposition: A | Discharge status: Home / Self Care | Payer: Managed Care, Other (non HMO) | Source: Ambulatory Visit | Attending: Orthopedic Surgery | Admitting: Orthopedic Surgery

## 2016-11-23 DIAGNOSIS — G4730 Sleep apnea, unspecified: Secondary | ICD-10-CM | POA: Insufficient documentation

## 2016-11-23 DIAGNOSIS — Z6839 Body mass index (BMI) 39.0-39.9, adult: Secondary | ICD-10-CM | POA: Insufficient documentation

## 2016-11-23 DIAGNOSIS — S83272A Complex tear of lateral meniscus, current injury, left knee, initial encounter: Secondary | ICD-10-CM | POA: Diagnosis not present

## 2016-11-23 DIAGNOSIS — Z79899 Other long term (current) drug therapy: Secondary | ICD-10-CM | POA: Insufficient documentation

## 2016-11-23 DIAGNOSIS — X58XXXA Exposure to other specified factors, initial encounter: Secondary | ICD-10-CM | POA: Insufficient documentation

## 2016-11-23 DIAGNOSIS — R39198 Other difficulties with micturition: Secondary | ICD-10-CM | POA: Insufficient documentation

## 2016-11-23 DIAGNOSIS — S83232A Complex tear of medial meniscus, current injury, left knee, initial encounter: Secondary | ICD-10-CM | POA: Insufficient documentation

## 2016-11-23 DIAGNOSIS — F17290 Nicotine dependence, other tobacco product, uncomplicated: Secondary | ICD-10-CM | POA: Insufficient documentation

## 2016-11-23 DIAGNOSIS — M94262 Chondromalacia, left knee: Secondary | ICD-10-CM | POA: Insufficient documentation

## 2016-11-23 DIAGNOSIS — I10 Essential (primary) hypertension: Secondary | ICD-10-CM | POA: Insufficient documentation

## 2016-11-23 HISTORY — PX: KNEE ARTHROSCOPY WITH MEDIAL MENISECTOMY: SHX5651

## 2016-11-23 HISTORY — DX: Sleep apnea, unspecified: G47.30

## 2016-11-23 SURGERY — ARTHROSCOPY, KNEE, WITH MEDIAL MENISCECTOMY
Anesthesia: General | Site: Knee | Laterality: Left

## 2016-11-23 MED ORDER — CEFAZOLIN SODIUM-DEXTROSE 2-4 GM/100ML-% IV SOLN
INTRAVENOUS | Status: AC
  Filled 2016-11-23: qty 100

## 2016-11-23 MED ORDER — PROPOFOL 10 MG/ML IV BOLUS
INTRAVENOUS | Status: AC
  Filled 2016-11-23: qty 20

## 2016-11-23 MED ORDER — DEXAMETHASONE SODIUM PHOSPHATE 10 MG/ML IJ SOLN
INTRAMUSCULAR | Status: AC
  Filled 2016-11-23: qty 1

## 2016-11-23 MED ORDER — FENTANYL CITRATE (PF) 100 MCG/2ML IJ SOLN
INTRAMUSCULAR | Status: AC
  Filled 2016-11-23: qty 2

## 2016-11-23 MED ORDER — HYDROCODONE-ACETAMINOPHEN 5-325 MG PO TABS: 1.0000 | ORAL_TABLET | Freq: Four times a day (QID) | ORAL | 0 refills | Status: DC | PRN

## 2016-11-23 MED ORDER — CEFAZOLIN SODIUM-DEXTROSE 2-4 GM/100ML-% IV SOLN
2.0000 g | INTRAVENOUS | Status: AC
  Administered 2016-11-23: 3 g via INTRAVENOUS

## 2016-11-23 MED ORDER — EPINEPHRINE 30 MG/30ML IJ SOLN
INTRAMUSCULAR | Status: AC
Start: 2016-11-23 — End: 2016-11-23
  Filled 2016-11-23: qty 1

## 2016-11-23 MED ORDER — ONDANSETRON HCL 4 MG/2ML IJ SOLN
INTRAMUSCULAR | Status: AC
  Filled 2016-11-23: qty 2

## 2016-11-23 MED ORDER — PROMETHAZINE HCL 25 MG/ML IJ SOLN: 6.2500 mg | INTRAMUSCULAR | Status: DC | PRN

## 2016-11-23 MED ORDER — FENTANYL CITRATE (PF) 100 MCG/2ML IJ SOLN: 50.0000 ug | INTRAMUSCULAR | Status: DC | PRN

## 2016-11-23 MED ORDER — PROPOFOL 10 MG/ML IV BOLUS
INTRAVENOUS | Status: DC | PRN
  Administered 2016-11-23: 200 mg via INTRAVENOUS

## 2016-11-23 MED ORDER — LIDOCAINE HCL (CARDIAC) 20 MG/ML IV SOLN
INTRAVENOUS | Status: DC | PRN
  Administered 2016-11-23: 30 mg via INTRAVENOUS

## 2016-11-23 MED ORDER — CHLORHEXIDINE GLUCONATE 4 % EX LIQD: 60.0000 mL | Freq: Once | CUTANEOUS | Status: DC

## 2016-11-23 MED ORDER — CEFAZOLIN IN D5W 1 GM/50ML IV SOLN
INTRAVENOUS | Status: AC
  Filled 2016-11-23: qty 50

## 2016-11-23 MED ORDER — LIDOCAINE 2% (20 MG/ML) 5 ML SYRINGE
INTRAMUSCULAR | Status: AC
  Filled 2016-11-23: qty 5

## 2016-11-23 MED ORDER — HYDROMORPHONE HCL 1 MG/ML IJ SOLN
INTRAMUSCULAR | Status: AC
  Filled 2016-11-23: qty 1

## 2016-11-23 MED ORDER — DEXTROSE-NACL 5-0.45 % IV SOLN: INTRAVENOUS | Status: DC

## 2016-11-23 MED ORDER — DEXAMETHASONE SODIUM PHOSPHATE 4 MG/ML IJ SOLN
INTRAMUSCULAR | Status: DC | PRN
  Administered 2016-11-23: 10 mg via INTRAVENOUS

## 2016-11-23 MED ORDER — EPINEPHRINE PF 1 MG/ML IJ SOLN
INTRAMUSCULAR | Status: DC | PRN
  Administered 2016-11-23: 3000 mL

## 2016-11-23 MED ORDER — ONDANSETRON HCL 4 MG/2ML IJ SOLN
INTRAMUSCULAR | Status: DC | PRN
  Administered 2016-11-23: 4 mg via INTRAVENOUS

## 2016-11-23 MED ORDER — FENTANYL CITRATE (PF) 100 MCG/2ML IJ SOLN
INTRAMUSCULAR | Status: DC | PRN
  Administered 2016-11-23: 100 ug via INTRAVENOUS
  Administered 2016-11-23: 25 ug via INTRAVENOUS

## 2016-11-23 MED ORDER — BUPIVACAINE-EPINEPHRINE (PF) 0.25% -1:200000 IJ SOLN
INTRAMUSCULAR | Status: DC | PRN
  Administered 2016-11-23: 20 mL via PERINEURAL

## 2016-11-23 MED ORDER — MIDAZOLAM HCL 2 MG/2ML IJ SOLN: 1.0000 mg | INTRAMUSCULAR | Status: DC | PRN

## 2016-11-23 MED ORDER — MIDAZOLAM HCL 2 MG/2ML IJ SOLN
INTRAMUSCULAR | Status: AC
  Filled 2016-11-23: qty 2

## 2016-11-23 MED ORDER — HYDROMORPHONE HCL 1 MG/ML IJ SOLN
0.2500 mg | INTRAMUSCULAR | Status: DC | PRN
  Administered 2016-11-23 (×2): 0.5 mg via INTRAVENOUS

## 2016-11-23 MED ORDER — BUPIVACAINE HCL (PF) 0.5 % IJ SOLN
INTRAMUSCULAR | Status: AC
  Filled 2016-11-23: qty 30

## 2016-11-23 MED ORDER — LACTATED RINGERS IV SOLN
INTRAVENOUS | Status: DC
  Administered 2016-11-23 (×2): via INTRAVENOUS

## 2016-11-23 MED ORDER — SCOPOLAMINE 1 MG/3DAYS TD PT72: 1.0000 | MEDICATED_PATCH | Freq: Once | TRANSDERMAL | Status: DC | PRN

## 2016-11-23 SURGICAL SUPPLY — 39 items
BANDAGE ACE 4X5 VEL STRL LF (GAUZE/BANDAGES/DRESSINGS) ×2 IMPLANT
BANDAGE ACE 6X5 VEL STRL LF (GAUZE/BANDAGES/DRESSINGS) ×3 IMPLANT
BLADE 4.2CUDA (BLADE) IMPLANT
BLADE CUTTER GATOR 3.5 (BLADE) ×2 IMPLANT
BLADE GREAT WHITE 4.2 (BLADE) ×2 IMPLANT
BNDG COHESIVE 6X5 TAN STRL LF (GAUZE/BANDAGES/DRESSINGS) ×3 IMPLANT
DRAPE ARTHROSCOPY W/POUCH 114 (DRAPES) ×3 IMPLANT
DURAPREP 26ML APPLICATOR (WOUND CARE) ×3 IMPLANT
ELECT MENISCUS 165MM 90D (ELECTRODE) IMPLANT
ELECT REM PT RETURN 9FT ADLT (ELECTROSURGICAL)
ELECTRODE REM PT RTRN 9FT ADLT (ELECTROSURGICAL) IMPLANT
GAUZE SPONGE 4X4 12PLY STRL (GAUZE/BANDAGES/DRESSINGS) ×3 IMPLANT
GAUZE XEROFORM 1X8 LF (GAUZE/BANDAGES/DRESSINGS) ×3 IMPLANT
GLOVE BIO SURGEON STRL SZ7.5 (GLOVE) ×3 IMPLANT
GLOVE BIO SURGEON STRL SZ8.5 (GLOVE) ×3 IMPLANT
GLOVE BIOGEL PI IND STRL 8 (GLOVE) ×2 IMPLANT
GLOVE BIOGEL PI IND STRL 9 (GLOVE) ×2 IMPLANT
GLOVE BIOGEL PI INDICATOR 8 (GLOVE) ×1
GLOVE BIOGEL PI INDICATOR 9 (GLOVE) ×1
GOWN STRL REUS W/ TWL LRG LVL3 (GOWN DISPOSABLE) ×4 IMPLANT
GOWN STRL REUS W/TWL LRG LVL3 (GOWN DISPOSABLE) ×6
GOWN STRL REUS W/TWL XL LVL3 (GOWN DISPOSABLE) ×3 IMPLANT
IV NS IRRIG 3000ML ARTHROMATIC (IV SOLUTION) ×3 IMPLANT
KNEE WRAP E Z 3 GEL PACK (MISCELLANEOUS) ×3 IMPLANT
MANIFOLD NEPTUNE II (INSTRUMENTS) ×2 IMPLANT
NDL SAFETY ECLIPSE 18X1.5 (NEEDLE) ×2 IMPLANT
NEEDLE HYPO 18GX1.5 SHARP (NEEDLE) ×3
PACK ARTHROSCOPY DSU (CUSTOM PROCEDURE TRAY) ×3 IMPLANT
PACK BASIN DAY SURGERY FS (CUSTOM PROCEDURE TRAY) ×3 IMPLANT
PAD ALCOHOL SWAB (MISCELLANEOUS) ×3 IMPLANT
PAD CAST 4YDX4 CTTN HI CHSV (CAST SUPPLIES) ×1 IMPLANT
PADDING CAST COTTON 4X4 STRL (CAST SUPPLIES) ×3
PENCIL BUTTON HOLSTER BLD 10FT (ELECTRODE) IMPLANT
PROBE BIPOLAR ATHRO 135MM 90D (MISCELLANEOUS) IMPLANT
SET ARTHROSCOPY TUBING (MISCELLANEOUS) ×3
SET ARTHROSCOPY TUBING LN (MISCELLANEOUS) ×2 IMPLANT
SYR 3ML 18GX1 1/2 (SYRINGE) IMPLANT
SYR 5ML LL (SYRINGE) ×3 IMPLANT
TOWEL OR 17X24 6PK STRL BLUE (TOWEL DISPOSABLE) ×3 IMPLANT

## 2016-11-23 NOTE — Anesthesia Procedure Notes (Signed)
Procedure Name: LMA Insertion Date/Time: 11/23/2016 12:06 PM Performed by: Genevieve NorlanderLINKA, Kerrin Markman L Pre-anesthesia Checklist: Patient identified, Emergency Drugs available, Suction available, Patient being monitored and Timeout performed Patient Re-evaluated:Patient Re-evaluated prior to inductionOxygen Delivery Method: Circle system utilized Preoxygenation: Pre-oxygenation with 100% oxygen Intubation Type: IV induction Ventilation: Mask ventilation without difficulty LMA: LMA inserted LMA Size: 5.0 Number of attempts: 1 Airway Equipment and Method: Bite block Placement Confirmation: positive ETCO2 Tube secured with: Tape Dental Injury: Teeth and Oropharynx as per pre-operative assessment

## 2016-11-23 NOTE — Op Note (Signed)
Pre-Op Dx: Left knee medial and lateral meniscal tears, chondromalacia  Postop Dx: Same   Procedure: Left knee arthroscopic partial medial and lateral meniscectomies, debridement chondromalacia from the trochlea grade 3 with flap tears  Surgeon: Feliberto GottronFrank J. Turner Danielsowan M.D.  Assist: Tomi LikensEric K. Gaylene BrooksPhillips PA-C  (present throughout entire procedure and necessary for timely completion of the procedure) Anes: General LMA  EBL: Minimal  Fluids: 800 cc   Indications: Catching popping and pain in the left knee. Pt has failed conservative treatment with anti-inflammatory medicines, physical therapy, and modified activites but did get good temporarily from an intra-articular cortisone injection. MRI scan shows lateral meniscal tear medial meniscal tear and chondromalacia trochlea Pain has recurred and patient desires elective arthroscopic evaluation and treatment of knee. Risks and benefits of surgery have been discussed and questions answered.  Procedure: Patient identified by arm band and taken to the operating room at the day surgery Center. The appropriate anesthetic monitors were attached, and General LMA anesthesia was induced without difficulty. Lateral post was applied to the table and the lower extremity was prepped and draped in usual sterile fashion from the ankle to the midthigh. Time out procedure was performed. We began the operation by making standard inferior lateral and inferior medial peripatellar portals with a #11 blade allowing introduction of the arthroscope through the inferior lateral portal and the out flow to the inferior medial portal. Pump pressure was set at 100 mmHg and diagnostic arthroscopy  revealed grade 3 chondromalacia of the trochlea with flap tears debrided back to a stable margin with a 3.5 mm Gator sucker shaver. The medial side there is a posterior horn medial meniscal tear of the inner rim degenerative as well as complex in nature this was debrided back to a stable margin articular  cartilage overall was in good shape on the medial side. The anterior cruciate ligament and the PCL were intact. On the lateral side there is complex tearing of the posterior lateral and anterior horns lateral meniscus debrided back to stable margin with a 4.2 gray-white sucker shaver and then finished with a 35 Gator sucker shaver. We also to use a straight biter a small portion of the posterior horn. The knee was irrigated out normal saline solution. A dressing of xerofoam 4 x 4 dressing sponges, web roll and an Ace wrap was applied. The patient was awakened extubated and taken to the recovery without difficulty.    Signed: Nestor LewandowskyFrank J Adilynne Fitzwater, MD

## 2016-11-23 NOTE — Discharge Instructions (Signed)

## 2016-11-23 NOTE — Anesthesia Postprocedure Evaluation (Addendum)
Anesthesia Post Note  Patient: Jared Lopez  Procedure(s) Performed: Procedure(s) (LRB): KNEE ARTHROSCOPY WITH MEDIAL AND LATERAL MENISECTOMY AND DEBRIEDMENT OF CONDROMALACIA  FEMUR (Left)  Patient location during evaluation: PACU Anesthesia Type: General Level of consciousness: sedated Pain management: pain level controlled Vital Signs Assessment: post-procedure vital signs reviewed and stable Respiratory status: spontaneous breathing and respiratory function stable Cardiovascular status: stable Anesthetic complications: no       Last Vitals:  Vitals:   11/23/16 1315 11/23/16 1330  BP: (!) 157/90 (!) 151/92  Pulse: 82 80  Resp: 20 (!) 22  Temp:      Last Pain:  Vitals:   11/23/16 1330  TempSrc:   PainSc: 4                  Xayvion Shirah DANIEL

## 2016-11-23 NOTE — Interval H&P Note (Signed)
History and Physical Interval Note:  11/23/2016 11:47 AM  Jared Lopez  has presented today for surgery, with the diagnosis of LEFT KNEE MEDIAL MENISCAL TEAR  The various methods of treatment have been discussed with the patient and family. After consideration of risks, benefits and other options for treatment, the patient has consented to  Procedure(s): ARTHROSCOPY LEFT KNEE (Left) as a surgical intervention .  The patient's history has been reviewed, patient examined, no change in status, stable for surgery.  I have reviewed the patient's chart and labs.  Questions were answered to the patient's satisfaction.     Nestor LewandowskyOWAN,Alvon Nygaard J

## 2016-11-23 NOTE — Transfer of Care (Signed)
Immediate Anesthesia Transfer of Care Note  Patient: Agustina CaroliRonald L Perezgarcia  Procedure(s) Performed: Procedure(s): KNEE ARTHROSCOPY WITH MEDIAL AND LATERAL MENISECTOMY AND DEBRIEDMENT OF CONDROMALACIA  FEMUR (Left)  Patient Location: PACU  Anesthesia Type:General  Level of Consciousness: sedated  Airway & Oxygen Therapy: Patient Spontanous Breathing and Patient connected to face mask oxygen  Post-op Assessment: Report given to RN and Post -op Vital signs reviewed and stable  Post vital signs: Reviewed and stable  Last Vitals:  Vitals:   11/23/16 1026  BP: 123/74  Pulse: 63  Resp: 18  Temp: 36.8 C    Last Pain:  Vitals:   11/23/16 1026  TempSrc: Oral  PainSc: 6       Patients Stated Pain Goal: 6 (11/23/16 1026)  Complications: No apparent anesthesia complications

## 2016-11-23 NOTE — Anesthesia Preprocedure Evaluation (Signed)
Anesthesia Evaluation  Patient identified by MRN, date of birth, ID band Patient awake    Reviewed: Allergy & Precautions, NPO status , Patient's Chart, lab work & pertinent test results  Airway Mallampati: II  TM Distance: >3 FB Neck ROM: Full    Dental no notable dental hx. (+) Poor Dentition, Dental Advisory Given   Pulmonary sleep apnea and Continuous Positive Airway Pressure Ventilation , Current Smoker,    Pulmonary exam normal        Cardiovascular hypertension, Pt. on medications Normal cardiovascular exam     Neuro/Psych negative neurological ROS  negative psych ROS   GI/Hepatic negative GI ROS, Neg liver ROS,   Endo/Other  negative endocrine ROSMorbid obesity  Renal/GU negative Renal ROS  negative genitourinary   Musculoskeletal negative musculoskeletal ROS (+)   Abdominal   Peds negative pediatric ROS (+)  Hematology negative hematology ROS (+)   Anesthesia Other Findings   Reproductive/Obstetrics negative OB ROS                             Anesthesia Physical Anesthesia Plan  ASA: III  Anesthesia Plan: General   Post-op Pain Management:    Induction: Intravenous  Airway Management Planned: LMA and Oral ETT  Additional Equipment:   Intra-op Plan:   Post-operative Plan: Extubation in OR  Informed Consent: I have reviewed the patients History and Physical, chart, labs and discussed the procedure including the risks, benefits and alternatives for the proposed anesthesia with the patient or authorized representative who has indicated his/her understanding and acceptance.   Dental advisory given  Plan Discussed with:   Anesthesia Plan Comments:         Anesthesia Quick Evaluation

## 2016-11-24 ENCOUNTER — Encounter (HOSPITAL_BASED_OUTPATIENT_CLINIC_OR_DEPARTMENT_OTHER): Payer: Self-pay | Admitting: Orthopedic Surgery

## 2016-11-24 NOTE — Addendum Note (Signed)
Addendum  created 11/24/16 1328 by Lance CoonWesley Neftali Thurow, CRNA   Charge Capture section accepted

## 2017-02-04 NOTE — Addendum Note (Signed)
Addendum  created 02/04/17 1043 by Namrata Dangler, MD   Sign clinical note    

## 2018-02-07 ENCOUNTER — Other Ambulatory Visit: Payer: Self-pay | Admitting: *Deleted

## 2018-02-07 NOTE — Patient Outreach (Signed)
[facility] Care Management  02/07/2018  Jared CaroliRonald L Jungbluth May 18, 1966 [id number]   Subjective: Telephone call to patient's home  [phone] number, spoke with male answering the phone, states Marlin CanaryRonald Mohs is currently not home, left HIPAA compliant message for Mr. Laural BenesJohnson, and requested call back.      Objective: Per KPN (Knowledge Performance Now, point of care tool), [employer] i[employer]aborate, and chart review, patient hospitalized 01/31/18 -02/03/18 [facility] [facility]    Patient also has a history of OSA with CPAP and hypertension.     Assessment: Received [employer] Transition of care referral on 02/06/18.    Transition of care follow up pending patient contact.       Plan: RNCM will send unsuccessful outreach  letter, [facility] pamphlet, will call patient for 2nd telephone outreach attempt, transition of care follow up, and proceed with case closure, within 10 business days if no return call.        Wallice Granville H. Gardiner Barefootooper RN, BSN, CCM [facility] Telephonic CM Phone: [phone] Fax: [phone]

## 2018-02-08 ENCOUNTER — Other Ambulatory Visit: Payer: Self-pay | Admitting: *Deleted

## 2018-02-08 NOTE — Patient Outreach (Addendum)
[facility] Care Management  02/08/2018  Jared Lopez 1965-11-14 [id number]   Subjective: Telephone call to patient's home  [phone] number, spoke with male answering the phone, states Jared Lopez is currently not home, left HIPAA compliant message for Jared Lopez, and requested call back.      Objective: Per KPN (Knowledge Performance Now, point of care tool), [employer] i[employer]aborate, and chart review, patient hospitalized 01/31/18 -02/03/[facility] [facility]    Patient also has a history of OSA with CPAP and hypertension.     Assessment: Received [employer] Transition of care referral on 02/06/18.    Transition of care follow up pending patient contact.       Plan: RNCM has sent unsuccessful outreach  letter, [facility] pamph[facility], will call patient for 3rd telephone outreach attempt, transition of care follow up, and proceed with case closure, within 10 business days if no return call.       Xylina Rhoads H. Gardiner Barefootooper RN, BSN, CCM [facility] Telephonic CM Phone: [phone] Fax: [phone]

## 2018-02-12 ENCOUNTER — Other Ambulatory Visit: Payer: Self-pay | Admitting: *Deleted

## 2018-02-12 NOTE — Patient Outreach (Addendum)
[facility] Care Management  02/12/2018  Jared Lopez 05/09/1966 [id number]   Subjective: Telephone call to patient's mobile number, spoke with patient, and HIPAA verified.  Discussed [facility] [employer] Transition of care follow up, patient voiced understanding, and is in agreement to follow up.   Patient states he is doing well, currently at work, had hospital follow up appointment with primary MD on 02/05/18, and appointment went well.   States primary [facility]  [facility] to follow up on sleep apnea because CPAP machine greater than 52 years old, and it is not working properly.   States he will follow up with primary MD's office tomorrow if he does not receive a follow up call to schedule sleep study per MD's office instructions. Patient states he is able to manage self care and has assistance as needed with activities of daily living / home management.   Patient voices understanding of medical diagnosis and treatment plan.  States he is accessing his [employer] benefits as needed via member services number on back of card or through [email or web address].   Patient states he does not have any education material, transition of care, care coordination, disease management, disease monitoring, transportation, community resource, or pharmacy needs at this time. States he is very appreciative of the follow up and is in agreement to receive [facility] Care [facility] information.     Objective:Per KPN (Knowledge Performance Now, point of care tool), [employer] iCollaborate, and chart review, patient [facility] [facility] Patient also has a history of OSA with CPAP and hypertension.     Assessment: Received [employer] Transition of care referral on 02/06/18.Transition of care follow up completed, no care management needs, and will proceed with case closure.        Plan:RNCM will send patient successful outreach letter, [facility] pamph[facility], and magnet. RNCM will complete case closure due to follow up completed / no care management needs.       Jared Baxendale H. Gardiner Barefootooper RN, BSN, CCM [facility] Care Management [facility] Telephonic CM Phone: [phone] Fax: [phone]

## 2019-10-02 ENCOUNTER — Other Ambulatory Visit: Payer: Self-pay | Admitting: Family Medicine

## 2019-10-02 DIAGNOSIS — R634 Abnormal weight loss: Secondary | ICD-10-CM

## 2019-10-10 ENCOUNTER — Other Ambulatory Visit: Payer: Managed Care, Other (non HMO)

## 2019-12-28 ENCOUNTER — Other Ambulatory Visit: Payer: Self-pay

## 2019-12-28 ENCOUNTER — Emergency Department (HOSPITAL_BASED_OUTPATIENT_CLINIC_OR_DEPARTMENT_OTHER)
Admission: EM | Admit: 2019-12-28 | Discharge: 2019-12-28 | Disposition: A | Discharge status: Home / Self Care | Payer: 59 | Source: Home / Self Care | Attending: Emergency Medicine | Admitting: Emergency Medicine

## 2019-12-28 ENCOUNTER — Emergency Department (HOSPITAL_BASED_OUTPATIENT_CLINIC_OR_DEPARTMENT_OTHER): Payer: 59

## 2019-12-28 ENCOUNTER — Encounter (HOSPITAL_BASED_OUTPATIENT_CLINIC_OR_DEPARTMENT_OTHER): Payer: Self-pay

## 2019-12-28 DIAGNOSIS — J1282 Pneumonia due to coronavirus disease 2019: Secondary | ICD-10-CM | POA: Insufficient documentation

## 2019-12-28 DIAGNOSIS — U071 COVID-19: Secondary | ICD-10-CM

## 2019-12-28 DIAGNOSIS — F17290 Nicotine dependence, other tobacco product, uncomplicated: Secondary | ICD-10-CM | POA: Insufficient documentation

## 2019-12-28 DIAGNOSIS — Z79899 Other long term (current) drug therapy: Secondary | ICD-10-CM | POA: Insufficient documentation

## 2019-12-28 HISTORY — DX: Benign prostatic hyperplasia without lower urinary tract symptoms: N40.0

## 2019-12-28 LAB — CBC WITH DIFFERENTIAL/PLATELET
Abs Immature Granulocytes: 0.1 10*3/uL — ABNORMAL HIGH (ref 0.00–0.07)
Basophils Absolute: 0.1 10*3/uL (ref 0.0–0.1)
Basophils Relative: 0 %
Eosinophils Absolute: 0 10*3/uL (ref 0.0–0.5)
Eosinophils Relative: 0 %
HCT: 44.5 % (ref 39.0–52.0)
Hemoglobin: 15 g/dL (ref 13.0–17.0)
Immature Granulocytes: 1 %
Lymphocytes Relative: 9 %
Lymphs Abs: 1.2 10*3/uL (ref 0.7–4.0)
MCH: 30.9 pg (ref 26.0–34.0)
MCHC: 33.7 g/dL (ref 30.0–36.0)
MCV: 91.8 fL (ref 80.0–100.0)
Monocytes Absolute: 1.3 10*3/uL — ABNORMAL HIGH (ref 0.1–1.0)
Monocytes Relative: 10 %
Neutro Abs: 10 10*3/uL — ABNORMAL HIGH (ref 1.7–7.7)
Neutrophils Relative %: 80 %
Platelets: 272 10*3/uL (ref 150–400)
RBC: 4.85 MIL/uL (ref 4.22–5.81)
RDW: 14 % (ref 11.5–15.5)
WBC: 12.6 10*3/uL — ABNORMAL HIGH (ref 4.0–10.5)
nRBC: 0 % (ref 0.0–0.2)

## 2019-12-28 LAB — BASIC METABOLIC PANEL
Anion gap: 10 (ref 5–15)
BUN: 10 mg/dL (ref 6–20)
CO2: 24 mmol/L (ref 22–32)
Calcium: 8.4 mg/dL — ABNORMAL LOW (ref 8.9–10.3)
Chloride: 101 mmol/L (ref 98–111)
Creatinine, Ser: 1.09 mg/dL (ref 0.61–1.24)
GFR calc Af Amer: >60 mL/min (ref >60)
GFR calc non Af Amer: >60 mL/min (ref >60)
Glucose, Bld: 139 mg/dL — ABNORMAL HIGH (ref 70–99)
Potassium: 3.4 mmol/L — ABNORMAL LOW (ref 3.5–5.1)
Sodium: 135 mmol/L (ref 135–145)

## 2019-12-28 LAB — SARS CORONAVIRUS 2 AG (30 MIN TAT): SARS Coronavirus 2 Ag: POSITIVE — AB

## 2019-12-28 MED ORDER — DEXAMETHASONE SODIUM PHOSPHATE 10 MG/ML IJ SOLN
10.0000 mg | Freq: Once | INTRAMUSCULAR | Status: AC
  Administered 2019-12-28: 10 mg via INTRAVENOUS
  Filled 2019-12-28: qty 1

## 2019-12-28 MED ORDER — ACETAMINOPHEN 325 MG PO TABS
650.0000 mg | ORAL_TABLET | Freq: Once | ORAL | Status: AC | PRN
  Administered 2019-12-28: 16:00:00 650 mg via ORAL
  Filled 2019-12-28: qty 2

## 2019-12-28 NOTE — Discharge Instructions (Signed)
Your Covid test today came back positive.  Additionally, your chest x-ray did show signs of pneumonia due to Covid.  Take Tylenol and ibuprofen for fevers and muscle aches.  You will need to quarantine for 2 weeks.  Make sure you are getting plenty of rest and staying hydrated.  Return the emergency department for any worsening difficulty breathing, chest pain, inability eat or drink or any other worsening or concerning symptoms.

## 2019-12-28 NOTE — ED Provider Notes (Signed)
[facility] EMERGENCY DEPARTMENT Provider Note   CSN: [id number] Arrival date & time: 12/28/19  1507     History Chief Complaint  Patient presents with  . Fever    Jared Lopez is a 54 y.o. male who presents for evaluation of fever, fatigue that began today as well as persistent cough that is been ongoing.  He states he has had a cough for about 2 weeks.  He states that he has tried to cough it up but states it is mostly dry.  He has had some episodes where he has coughed up phlegm with small strings of blood.  No gross hemoptysis.  He states that today he woke up and started noticing that he was very hot and took his temperature and noted that he had a fever.  He states he felt like he had no energy and was very fatigued.  He denies any chest pain or cough but does states he feels like he is congested in his chest and nose.  He states he took a Covid test last week because he was getting ready to see an urgent care doctor.  He reports that it was negative but he was not symptomatic at that time.  No known COVID-19 exposure.  No recent travel.  No history of asthma or COPD.  He occasionally smokes cigars but no cigarette smoking.  The history is provided by the patient.       Past Medical History:  Diagnosis Date  . Abdominal pain   . Arthritis   . Blood in stool   . Blurred vision   . Chronic back pain   . Constipation   . Difficulty urinating   . Enlarged prostate   . Generalized headaches    high blood pressure onset  . Hernia   . Hyperlipidemia   . Hypertension   . Mass of perirectal soft tissue   . Over weight   . Rectal bleeding   . Sleep apnea    uses CPAP nightly    Patient Active Problem List   Diagnosis Date Noted  . Hemorrhoids 05/10/2011  . Hemorrhoids 03/29/2011  . Internal hemorrhoids 03/29/2011    Past Surgical History:  Procedure Laterality Date  . BACK SURGERY    . CARDIAC CATHETERIZATION  2013   Dr Jacinto Halim  . HERNIA REPAIR    .  KNEE ARTHROSCOPY WITH MEDIAL MENISECTOMY Left 11/23/2016   Procedure: KNEE ARTHROSCOPY WITH MEDIAL AND LATERAL MENISECTOMY AND DEBRIEDMENT OF CONDROMALACIA  FEMUR;  Surgeon: Gean Birchwood, MD;  Location: [facility];  Service: Orthopedics;  Laterality: Left;  . LEFT HEART CATHETERIZATION WITH CORONARY ANGIOGRAM N/A 06/12/2012   Procedure: LEFT HEART CATHETERIZATION WITH CORONARY ANGIOGRAM;  Surgeon: Pamella Pert, MD;  Location: [facility] CATH LAB;  Service: Cardiovascular;  Laterality: N/A;       Family History  Problem Relation Age of Onset  . Heart disease Mother   . Hypertension Mother     Social History   Tobacco Use  . Smoking status: Current Some Day Smoker    Types: Cigars  . Smokeless tobacco: Never Used  Substance Use Topics  . Alcohol use: Yes    Alcohol/week: 1.0 - 2.0 standard drinks    Types: 1 - 2 Cans of beer per week    Comment: social  . Drug use: No    Home Medications Prior to Admission medications   Medication Sig Start Date End Date Taking? Authorizing Provider  hydrochlorothiazide (HYDRODIURIL) 25  MG tablet Take 25 mg by mouth daily.   Yes [provider]  ISOSORBIDE DINITRATE PO Take 20 mg by mouth.    Yes [provider]  Tamsulosin HCl (FLOMAX) 0.4 MG CAPS 0.4 mg daily after supper. 03/29/11  Yes [provider]  verapamil (CALAN-SR) 240 MG CR tablet Take 240 mg by mouth daily.     Yes [provider]  gabapentin (NEURONTIN) 300 MG capsule Take 600 mg by mouth at bedtime. 11/15/19   [provider]  HYDROcodone-acetaminophen (NORCO) 5-325 MG tablet Take 1-2 tablets by mouth every 6 (six) hours as needed. Patient not taking: Reported on 02/12/2018 11/23/16   Allena Katz, PA-C    Allergies    Patient has no known allergies.  Review of Systems   Review of Systems  Constitutional: Positive for fatigue and fever.  HENT: Positive for congestion.   Respiratory: Positive for cough. Negative for  shortness of breath.   Cardiovascular: Negative for chest pain.  Gastrointestinal: Negative for abdominal pain, nausea and vomiting.  Genitourinary: Negative for dysuria and hematuria.  Neurological: Negative for headaches.  All other systems reviewed and are negative.   Physical Exam Updated Vital Signs BP 112/66   Pulse 83   Temp 98.9 F (37.2 C) (Oral)   Resp (!) 26   SpO2 94% Comment: room air  Physical Exam Vitals and nursing note reviewed.  Constitutional:      Appearance: Normal appearance. He is well-developed.     Comments: Sitting comfortably on examination table  HENT:     Head: Normocephalic and atraumatic.  Eyes:     General: Lids are normal.     Conjunctiva/sclera: Conjunctivae normal.     Pupils: Pupils are equal, round, and reactive to light.  Cardiovascular:     Rate and Rhythm: Normal rate and regular rhythm.     Pulses: Normal pulses.     Heart sounds: Normal heart sounds. No murmur. No friction rub. No gallop.   Pulmonary:     Effort: Pulmonary effort is normal.     Breath sounds: Normal breath sounds.     Comments: Lungs clear to auscultation bilaterally.  Symmetric chest rise.  No wheezing, rales, rhonchi. No evidence of respiratory distress.  Abdominal:     Palpations: Abdomen is soft. Abdomen is not rigid.     Tenderness: There is no abdominal tenderness. There is no guarding.     Comments: Abdomen is soft, non-distended, non-tender. No rigidity, No guarding. No peritoneal signs.  Musculoskeletal:        General: Normal range of motion.     Cervical back: Full passive range of motion without pain.  Skin:    General: Skin is warm and dry.     Capillary Refill: Capillary refill takes less than 2 seconds.  Neurological:     Mental Status: He is alert and oriented to person, place, and time.  Psychiatric:        Speech: Speech normal.     ED Results / Procedures / Treatments   Labs (all labs ordered are listed, but only abnormal results are  displayed) Labs Reviewed  SARS CORONAVIRUS 2 AG (30 MIN TAT) - Abnormal; Notable for the following components:      Result Value   SARS Coronavirus 2 Ag POSITIVE (*)    All other components within normal limits  CBC WITH DIFFERENTIAL/PLATELET - Abnormal; Notable for the following components:   WBC 12.6 (*)    Neutro Abs 10.0 (*)  Monocytes Absolute 1.3 (*)    Abs Immature Granulocytes 0.10 (*)    All other components within normal limits  BASIC METABOLIC PANEL - Abnormal; Notable for the following components:   Potassium 3.4 (*)    Glucose, Bld 139 (*)    Calcium 8.4 (*)    All other components within normal limits    EKG None  Radiology DG Chest Portable 1 View  Result Date: 12/28/2019 CLINICAL DATA:  Congestion, cough, fever EXAM: PORTABLE CHEST 1 VIEW COMPARISON:  01/31/2018 FINDINGS: Single frontal view of the chest demonstrates a stable cardiac silhouette. Interval development of multifocal bilateral airspace disease greatest in the right upper and left lower lung zones. No effusion or pneumothorax. No acute bony abnormality. IMPRESSION: 1. Multifocal bilateral pneumonia. Electronically Signed   By: Randa Ngo M.D.   On: 12/28/2019 16:19    Procedures Procedures (including critical care time)  Medications Ordered in ED Medications  acetaminophen (TYLENOL) tablet 650 mg (650 mg Oral Given 12/28/19 1531)  dexamethasone (DECADRON) injection 10 mg (10 mg Intravenous Given 12/28/19 1809)    ED Course  I have reviewed the triage vital signs and the nursing notes.  Pertinent labs & imaging results that were available during my care of the patient were reviewed by me and considered in my medical decision making (see chart for details).    MDM Rules/Calculators/A&P                      54 year old gentleman who presents for evaluation of cough, fever, fatigue, congestion.  Recent Covid test about a week ago which was negative.  States that he has been having some  persistent cough for the last few weeks but fever, fatigue did not start until today.  No known COVID-19 exposure.  He has not gotten vaccinated yet.  No recent travel.  Initially arrival, he is febrile, slightly tachycardic.  Vitals otherwise stable.  No evidence of respiratory distress.  He is speaking full sentences without any difficulty.  His initial O2 sat was 93% on room air.  He does have some rales noted bilaterally but no evidence of wheezing.  Concern for infectious process such as COVID-19 versus pneumonia.  Plan for chest x-ray.  Patient is Covid positive.  Chest x-ray shows multifocal bilateral pneumonia.  Chest x-ray shows slight leukocytosis of 12.6.  Otherwise unremarkable.  BMP shows potassium of 3.4.  Normal BUN and creatinine.  Patient has been maintaining O2 sats between 90-95.  He ambulated maintained between 90-93%.  Discussed results with patient.  Patient is resting comfortably on bed with no signs of distress.  Encouraged at home supportive care measures.  At this time, patient has no hypoxia requiring oxygen.  Stable for discharge home. At this time, patient exhibits no emergent life-threatening condition that require further evaluation in ED or admission. Patient had ample opportunity for questions and discussion. All patient's questions were answered with full understanding. Strict return precautions discussed. Patient expresses understanding and agreement to plan.   Jared Lopez was evaluated in Emergency Department on 12/28/2019 for the symptoms described in the history of present illness. He was evaluated in the context of the global COVID-19 pandemic, which necessitated consideration that the patient might be at risk for infection with the SARS-CoV-2 virus that causes COVID-19. Institutional protocols and algorithms that pertain to the evaluation of patients at risk for COVID-19 are in a state of rapid change based on information released by regulatory bodies including the  CDC and federal and [employer]. These policies and algorithms were followed during the patient's care in the ED.  Portions of this note were generated with Scientist, clinical (histocompatibility and immunogenetics). Dictation errors may occur despite best attempts at proofreading.   Final Clinical Impression(s) / ED Diagnoses Final diagnoses:  COVID-19  Pneumonia due to COVID-19 virus    Rx / DC Orders ED Discharge Orders    None       Rosana Hoes 12/28/19 2221    Rolan Bucco, MD 12/28/19 2317

## 2019-12-28 NOTE — ED Notes (Signed)
ED Provider at bedside. 

## 2019-12-28 NOTE — ED Triage Notes (Signed)
Pt c/o 102 fever this AM. Pt has associated congested cough, congested nose, fatigue. Symptoms started this AM. Pt had a negtive COVID test last Tuesday "just because," no symptoms at that time.

## 2019-12-28 NOTE — ED Notes (Addendum)
Pt ambulated in room with pulse ox- O2 sats 91-93% on room air. NAD, provider made aware

## 2019-12-28 NOTE — ED Notes (Deleted)
Per Okey Regal in lab, pt is Covid+. RN Joss notified

## 2019-12-29 ENCOUNTER — Other Ambulatory Visit: Payer: Self-pay | Admitting: Physician Assistant

## 2019-12-29 DIAGNOSIS — U071 COVID-19: Secondary | ICD-10-CM

## 2019-12-29 MED ORDER — SODIUM CHLORIDE 0.9 % IV SOLN
Freq: Once | INTRAVENOUS | Status: DC
  Filled 2019-12-29: qty 20

## 2019-12-29 NOTE — Progress Notes (Signed)
  I connected by phone with [name] on 12/29/2019 at 8:05 AM to discuss the potential use of an new treatment for mild to moderate COVID-19 viral infection in non-hospitalized patients.  This patient is a 54 y.o. male that meets the FDA criteria for Emergency Use Authorization of bamlanivimab/etesevimab or casirivimab/imdevimab.  Has a (+) direct SARS-CoV-2 viral test result  Has mild or moderate COVID-19   Is ? 54 years of age and weighs ? 40 kg  Is NOT hospitalized due to COVID-19  Is NOT requiring oxygen therapy or requiring an increase in baseline oxygen flow rate due to COVID-19  Is within 10 days of symptom onset  Has at least one of the high risk factor(s) for progression to severe COVID-19 and/or hospitalization as defined in EUA.  Specific high risk criteria : BMI >/= 35   I have spoken and communicated the following to the patient or parent/caregiver:  1. FDA has authorized the emergency use of bamlanivimab/etesevimab and casirivimab\imdevimab for the treatment of mild to moderate COVID-19 in adults and pediatric patients with positive results of direct SARS-CoV-2 viral testing who are 31 years of age and older weighing at least 40 kg, and who are at high risk for progressing to severe COVID-19 and/or hospitalization.  2. The significant known and potential risks and benefits of bamlanivimab/etesevimab and casirivimab\imdevimab, and the extent to which such potential risks and benefits are unknown.  3. Information on available alternative treatments and the risks and benefits of those alternatives, including clinical trials.  4. Patients treated with bamlanivimab/etesevimab and casirivimab\imdevimab should continue to self-isolate and use infection control measures (e.g., wear mask, isolate, social distance, avoid sharing personal items, clean and disinfect "high touch" surfaces, and frequent handwashing) according to CDC guidelines.   5. The patient or  parent/caregiver has the option to accept or refuse bamlanivimab/etesevimab or casirivimab\imdevimab .  After reviewing this information with the patient, The patient agreed to proceed with receiving the bamlanivimab/etesevimab infusion and will be provided a copy of the Fact sheet prior to receiving the infusion.  Sx onset 4/20. Set up for infusion tomorrow at 9:30am. Directions given.  Cline Crock 12/29/2019 8:05 AM

## 2019-12-30 ENCOUNTER — Ambulatory Visit (HOSPITAL_COMMUNITY)
Admission: RE | Admit: 2019-12-30 | Discharge: 2019-12-30 | Disposition: A | Discharge status: Home / Self Care | Payer: 59 | Source: Ambulatory Visit | Attending: Pulmonary Disease | Admitting: Pulmonary Disease

## 2019-12-30 ENCOUNTER — Other Ambulatory Visit: Payer: Self-pay

## 2019-12-30 ENCOUNTER — Emergency Department (HOSPITAL_COMMUNITY): Payer: 59

## 2019-12-30 ENCOUNTER — Encounter (HOSPITAL_COMMUNITY): Payer: Self-pay | Admitting: *Deleted

## 2019-12-30 ENCOUNTER — Inpatient Hospital Stay (HOSPITAL_COMMUNITY)
Admission: EM | Admit: 2019-12-30 | Discharge: 2020-01-06 | DRG: 177 | Disposition: A | Discharge status: Home / Self Care | Payer: 59 | Attending: Internal Medicine | Admitting: Internal Medicine

## 2019-12-30 ENCOUNTER — Other Ambulatory Visit (HOSPITAL_COMMUNITY): Payer: Self-pay

## 2019-12-30 DIAGNOSIS — J982 Interstitial emphysema: Secondary | ICD-10-CM | POA: Diagnosis present

## 2019-12-30 DIAGNOSIS — R778 Other specified abnormalities of plasma proteins: Secondary | ICD-10-CM

## 2019-12-30 DIAGNOSIS — N400 Enlarged prostate without lower urinary tract symptoms: Secondary | ICD-10-CM | POA: Diagnosis present

## 2019-12-30 DIAGNOSIS — I110 Hypertensive heart disease with heart failure: Secondary | ICD-10-CM | POA: Diagnosis present

## 2019-12-30 DIAGNOSIS — E785 Hyperlipidemia, unspecified: Secondary | ICD-10-CM | POA: Diagnosis present

## 2019-12-30 DIAGNOSIS — M549 Dorsalgia, unspecified: Secondary | ICD-10-CM | POA: Diagnosis present

## 2019-12-30 DIAGNOSIS — I5043 Acute on chronic combined systolic (congestive) and diastolic (congestive) heart failure: Secondary | ICD-10-CM | POA: Diagnosis present

## 2019-12-30 DIAGNOSIS — R0602 Shortness of breath: Secondary | ICD-10-CM

## 2019-12-30 DIAGNOSIS — R748 Abnormal levels of other serum enzymes: Secondary | ICD-10-CM | POA: Diagnosis present

## 2019-12-30 DIAGNOSIS — G8929 Other chronic pain: Secondary | ICD-10-CM | POA: Diagnosis present

## 2019-12-30 DIAGNOSIS — Z79891 Long term (current) use of opiate analgesic: Secondary | ICD-10-CM

## 2019-12-30 DIAGNOSIS — F17290 Nicotine dependence, other tobacco product, uncomplicated: Secondary | ICD-10-CM | POA: Diagnosis present

## 2019-12-30 DIAGNOSIS — J1282 Pneumonia due to coronavirus disease 2019: Secondary | ICD-10-CM | POA: Diagnosis present

## 2019-12-30 DIAGNOSIS — U071 COVID-19: Principal | ICD-10-CM | POA: Diagnosis present

## 2019-12-30 DIAGNOSIS — M7989 Other specified soft tissue disorders: Secondary | ICD-10-CM | POA: Diagnosis present

## 2019-12-30 DIAGNOSIS — I4891 Unspecified atrial fibrillation: Secondary | ICD-10-CM | POA: Diagnosis present

## 2019-12-30 DIAGNOSIS — I472 Ventricular tachycardia: Secondary | ICD-10-CM | POA: Diagnosis present

## 2019-12-30 DIAGNOSIS — M1990 Unspecified osteoarthritis, unspecified site: Secondary | ICD-10-CM | POA: Diagnosis present

## 2019-12-30 DIAGNOSIS — Z8249 Family history of ischemic heart disease and other diseases of the circulatory system: Secondary | ICD-10-CM

## 2019-12-30 DIAGNOSIS — I480 Paroxysmal atrial fibrillation: Secondary | ICD-10-CM | POA: Diagnosis present

## 2019-12-30 DIAGNOSIS — Z6839 Body mass index (BMI) 39.0-39.9, adult: Secondary | ICD-10-CM

## 2019-12-30 DIAGNOSIS — I10 Essential (primary) hypertension: Secondary | ICD-10-CM | POA: Diagnosis present

## 2019-12-30 DIAGNOSIS — Z79899 Other long term (current) drug therapy: Secondary | ICD-10-CM | POA: Diagnosis not present

## 2019-12-30 DIAGNOSIS — G4733 Obstructive sleep apnea (adult) (pediatric): Secondary | ICD-10-CM | POA: Diagnosis present

## 2019-12-30 DIAGNOSIS — J9601 Acute respiratory failure with hypoxia: Secondary | ICD-10-CM | POA: Diagnosis present

## 2019-12-30 DIAGNOSIS — E669 Obesity, unspecified: Secondary | ICD-10-CM | POA: Diagnosis present

## 2019-12-30 LAB — ABO/RH: ABO/RH(D): O POS

## 2019-12-30 LAB — CBC WITH DIFFERENTIAL/PLATELET
Abs Immature Granulocytes: 0.07 10*3/uL (ref 0.00–0.07)
Basophils Absolute: 0 10*3/uL (ref 0.0–0.1)
Basophils Relative: 0 %
Eosinophils Absolute: 0 10*3/uL (ref 0.0–0.5)
Eosinophils Relative: 0 %
HCT: 43.6 % (ref 39.0–52.0)
Hemoglobin: 14.3 g/dL (ref 13.0–17.0)
Immature Granulocytes: 1 %
Lymphocytes Relative: 15 %
Lymphs Abs: 2 10*3/uL (ref 0.7–4.0)
MCH: 30.4 pg (ref 26.0–34.0)
MCHC: 32.8 g/dL (ref 30.0–36.0)
MCV: 92.8 fL (ref 80.0–100.0)
Monocytes Absolute: 1 10*3/uL (ref 0.1–1.0)
Monocytes Relative: 7 %
Neutro Abs: 10.7 10*3/uL — ABNORMAL HIGH (ref 1.7–7.7)
Neutrophils Relative %: 77 %
Platelets: 297 10*3/uL (ref 150–400)
RBC: 4.7 MIL/uL (ref 4.22–5.81)
RDW: 13.7 % (ref 11.5–15.5)
WBC: 13.7 10*3/uL — ABNORMAL HIGH (ref 4.0–10.5)
nRBC: 0 % (ref 0.0–0.2)

## 2019-12-30 LAB — COMPREHENSIVE METABOLIC PANEL
ALT: 50 U/L — ABNORMAL HIGH (ref 0–44)
AST: 47 U/L — ABNORMAL HIGH (ref 15–41)
Albumin: 2.7 g/dL — ABNORMAL LOW (ref 3.5–5.0)
Alkaline Phosphatase: 50 U/L (ref 38–126)
Anion gap: 11 (ref 5–15)
BUN: 9 mg/dL (ref 6–20)
CO2: 26 mmol/L (ref 22–32)
Calcium: 7.9 mg/dL — ABNORMAL LOW (ref 8.9–10.3)
Chloride: 102 mmol/L (ref 98–111)
Creatinine, Ser: 1.23 mg/dL (ref 0.61–1.24)
GFR calc Af Amer: >60 mL/min (ref >60)
GFR calc non Af Amer: >60 mL/min (ref >60)
Glucose, Bld: 108 mg/dL — ABNORMAL HIGH (ref 70–99)
Potassium: 3.5 mmol/L (ref 3.5–5.1)
Sodium: 139 mmol/L (ref 135–145)
Total Bilirubin: 1.2 mg/dL (ref 0.3–1.2)
Total Protein: 6 g/dL — ABNORMAL LOW (ref 6.5–8.1)

## 2019-12-30 LAB — LACTATE DEHYDROGENASE: LDH: 287 U/L — ABNORMAL HIGH (ref 98–192)

## 2019-12-30 LAB — HIV ANTIBODY (ROUTINE TESTING W REFLEX): HIV Screen 4th Generation wRfx: NONREACTIVE

## 2019-12-30 LAB — MAGNESIUM: Magnesium: 1.8 mg/dL (ref 1.7–2.4)

## 2019-12-30 LAB — FERRITIN: Ferritin: 696 ng/mL — ABNORMAL HIGH (ref 24–336)

## 2019-12-30 LAB — PROCALCITONIN: Procalcitonin: <0.1 ng/mL

## 2019-12-30 LAB — HEPATITIS B SURFACE ANTIGEN: Hepatitis B Surface Ag: NONREACTIVE

## 2019-12-30 LAB — FIBRINOGEN: Fibrinogen: 605 mg/dL — ABNORMAL HIGH (ref 210–475)

## 2019-12-30 LAB — BRAIN NATRIURETIC PEPTIDE: B Natriuretic Peptide: 48.3 pg/mL (ref 0.0–100.0)

## 2019-12-30 LAB — D-DIMER, QUANTITATIVE
D-Dimer, Quant: 0.66 ug/mL-FEU — ABNORMAL HIGH (ref 0.00–0.50)
D-Dimer, Quant: 0.6 ug/mL-FEU — ABNORMAL HIGH (ref 0.00–0.50)

## 2019-12-30 LAB — TROPONIN I (HIGH SENSITIVITY): Troponin I (High Sensitivity): 24 ng/L — ABNORMAL HIGH (ref <18)

## 2019-12-30 LAB — TSH: TSH: 1.417 u[IU]/mL (ref 0.350–4.500)

## 2019-12-30 MED ORDER — ASCORBIC ACID 500 MG PO TABS
500.0000 mg | ORAL_TABLET | Freq: Every day | ORAL | Status: DC
  Administered 2019-12-30 – 2020-01-06 (×8): 500 mg via ORAL
  Filled 2019-12-30 (×8): qty 1

## 2019-12-30 MED ORDER — HYDROCOD POLST-CPM POLST ER 10-8 MG/5ML PO SUER: 5.0000 mL | Freq: Two times a day (BID) | ORAL | Status: DC | PRN

## 2019-12-30 MED ORDER — ONDANSETRON HCL 4 MG PO TABS: 4.0000 mg | ORAL_TABLET | Freq: Four times a day (QID) | ORAL | Status: DC | PRN

## 2019-12-30 MED ORDER — SODIUM CHLORIDE 0.9% FLUSH
3.0000 mL | Freq: Two times a day (BID) | INTRAVENOUS | Status: DC
  Administered 2019-12-30 – 2020-01-06 (×15): 3 mL via INTRAVENOUS

## 2019-12-30 MED ORDER — SODIUM CHLORIDE 0.9 % IV SOLN
200.0000 mg | Freq: Once | INTRAVENOUS | Status: AC
  Administered 2019-12-30: 200 mg via INTRAVENOUS
  Filled 2019-12-30: qty 40

## 2019-12-30 MED ORDER — DILTIAZEM LOAD VIA INFUSION
20.0000 mg | Freq: Once | INTRAVENOUS | Status: AC
  Administered 2019-12-30: 20 mg via INTRAVENOUS
  Filled 2019-12-30: qty 20

## 2019-12-30 MED ORDER — ZINC SULFATE 220 (50 ZN) MG PO CAPS
220.0000 mg | ORAL_CAPSULE | Freq: Every day | ORAL | Status: DC
  Administered 2019-12-30 – 2020-01-06 (×8): 220 mg via ORAL
  Filled 2019-12-30 (×8): qty 1

## 2019-12-30 MED ORDER — TAMSULOSIN HCL 0.4 MG PO CAPS
0.4000 mg | ORAL_CAPSULE | ORAL | Status: DC
  Administered 2019-12-30 – 2020-01-05 (×7): 0.4 mg via ORAL
  Filled 2019-12-30 (×7): qty 1

## 2019-12-30 MED ORDER — SODIUM CHLORIDE 0.9 % IV SOLN
100.0000 mg | Freq: Every day | INTRAVENOUS | Status: AC
  Administered 2019-12-31 – 2020-01-03 (×4): 100 mg via INTRAVENOUS
  Filled 2019-12-30 (×5): qty 20

## 2019-12-30 MED ORDER — ENOXAPARIN SODIUM 40 MG/0.4ML ~~LOC~~ SOLN
40.0000 mg | SUBCUTANEOUS | Status: DC
  Administered 2019-12-30: 40 mg via SUBCUTANEOUS
  Filled 2019-12-30: qty 0.4

## 2019-12-30 MED ORDER — ALBUTEROL SULFATE HFA 108 (90 BASE) MCG/ACT IN AERS
2.0000 | INHALATION_SPRAY | Freq: Four times a day (QID) | RESPIRATORY_TRACT | Status: DC
  Administered 2019-12-30 – 2020-01-06 (×28): 2 via RESPIRATORY_TRACT
  Filled 2019-12-30 (×2): qty 6.7

## 2019-12-30 MED ORDER — ACETAMINOPHEN 325 MG PO TABS: 650.0000 mg | ORAL_TABLET | Freq: Four times a day (QID) | ORAL | Status: DC | PRN

## 2019-12-30 MED ORDER — DEXAMETHASONE 6 MG PO TABS
6.0000 mg | ORAL_TABLET | ORAL | Status: DC
  Administered 2019-12-30 – 2019-12-31 (×2): 6 mg via ORAL
  Filled 2019-12-30: qty 2
  Filled 2019-12-30: qty 1

## 2019-12-30 MED ORDER — ONDANSETRON HCL 4 MG/2ML IJ SOLN
4.0000 mg | Freq: Four times a day (QID) | INTRAMUSCULAR | Status: DC | PRN
  Administered 2019-12-30 – 2020-01-05 (×2): 4 mg via INTRAVENOUS
  Filled 2019-12-30 (×2): qty 2

## 2019-12-30 MED ORDER — DILTIAZEM HCL-DEXTROSE 125-5 MG/125ML-% IV SOLN (PREMIX)
5.0000 mg/h | INTRAVENOUS | Status: DC
  Administered 2019-12-30: 5 mg/h via INTRAVENOUS
  Administered 2019-12-31: 15 mg/h via INTRAVENOUS
  Filled 2019-12-30 (×3): qty 125

## 2019-12-30 MED ORDER — FAMOTIDINE 20 MG PO TABS
20.0000 mg | ORAL_TABLET | Freq: Two times a day (BID) | ORAL | Status: DC
  Administered 2019-12-30 – 2020-01-06 (×14): 20 mg via ORAL
  Filled 2019-12-30 (×15): qty 1

## 2019-12-30 MED ORDER — ACETAMINOPHEN 325 MG PO TABS
650.0000 mg | ORAL_TABLET | Freq: Once | ORAL | Status: AC
  Administered 2019-12-30: 650 mg via ORAL
  Filled 2019-12-30: qty 2

## 2019-12-30 MED ORDER — MAGNESIUM SULFATE IN D5W 1-5 GM/100ML-% IV SOLN
1.0000 g | Freq: Once | INTRAVENOUS | Status: AC
  Administered 2019-12-30: 1 g via INTRAVENOUS
  Filled 2019-12-30: qty 100

## 2019-12-30 MED ORDER — GUAIFENESIN-DM 100-10 MG/5ML PO SYRP: 10.0000 mL | ORAL_SOLUTION | ORAL | Status: DC | PRN

## 2019-12-30 MED ORDER — POTASSIUM CHLORIDE CRYS ER 20 MEQ PO TBCR
60.0000 meq | EXTENDED_RELEASE_TABLET | ORAL | Status: AC
  Administered 2019-12-30: 60 meq via ORAL
  Filled 2019-12-30: qty 3

## 2019-12-30 NOTE — ED Provider Notes (Signed)
[facility] EMERGENCY DEPARTMENT Provider Note   CSN: [id number] Arrival date & time: 12/30/19  1001     History Chief Complaint  Patient presents with  . Atrial Fibrillation  . Covid Exposure    Jared Lopez is a 54 y.o. male.  The history is provided by the patient, the EMS personnel and medical records. No language interpreter was used.  Atrial Fibrillation   Jared Lopez is a 54 y.o. male who presents to the Emergency Department complaining of shortness of breath. He presents the emergency department by EMS from the COVID infusion clinic for evaluation of shortness of breath. He began feeling poorly on Saturday with nasal congestion, cough and shortness of breath and he was seen in the emergency department and tested positive for COVID-19. He was scheduled for an infusion today and when he walked into the clinic he experienced increased shortness of breath. He denies any fevers, chest pain, nausea, vomiting, abdominal pain. He has chronic lower extremity edema and states that this is unchanged from his baseline. He denies any palpitations. EMS reports hypoxia with stats in the 80s as well as tachycardia with atrial fibrillation on the monitor with occasional 6 to 8 beat runs a V tach.  He reports small volume hemoptysis a few days ago, none currently.    Past Medical History:  Diagnosis Date  . Abdominal pain   . Arthritis   . Blood in stool   . Blurred vision   . Chronic back pain   . Constipation   . Difficulty urinating   . Enlarged prostate   . Generalized headaches    high blood pressure onset  . Hernia   . Hyperlipidemia   . Hypertension   . Mass of perirectal soft tissue   . Over weight   . Rectal bleeding   . Sleep apnea    uses CPAP nightly    Patient Active Problem List   Diagnosis Date Noted  . Pneumonia due to COVID-19 virus 12/30/2019  . Hemorrhoids 05/10/2011  . Hemorrhoids 03/29/2011  . Internal hemorrhoids 03/29/2011     Past Surgical History:  Procedure Laterality Date  . BACK SURGERY    . CARDIAC CATHETERIZATION  2013   Dr Jacinto Halim  . HERNIA REPAIR    . KNEE ARTHROSCOPY WITH MEDIAL MENISECTOMY Left 11/23/2016   Procedure: KNEE ARTHROSCOPY WITH MEDIAL AND LATERAL MENISECTOMY AND DEBRIEDMENT OF CONDROMALACIA  FEMUR;  Surgeon: Gean Birchwood, MD;  Location: [facility];  Service: Orthopedics;  Laterality: Left;  . LEFT HEART CATHETERIZATION WITH CORONARY ANGIOGRAM N/A 06/12/2012   Procedure: LEFT HEART CATHETERIZATION WITH CORONARY ANGIOGRAM;  Surgeon: Pamella Pert, MD;  Location[facility] C[facility] LAB;  Service: Cardiovascular;  Laterality: N/A;       Family History  Problem Relation Age of Onset  . Heart disease Mother   . Hypertension Mother     Social History   Tobacco Use  . Smoking status: Current Some Day Smoker    Types: Cigars  . Smokeless tobacco: Never Used  Substance Use Topics  . Alcohol use: Yes    Alcohol/week: 1.0 - 2.0 standard drinks    Types: 1 - 2 Cans of beer per week    Comment: social  . Drug use: No    Home Medications Prior to Admission medications   Medication Sig Start Date End Date Taking? Authorizing Provider  dicyclomine (BENTYL) 20 MG tablet Take 20 mg by mouth See admin instructions. 20mg  once daily  30 minutes before breakfast and then 20mg  at bedtime for spasmic colon. 12/13/19  Yes [provider]  isosorbide mononitrate (ISMO) 20 MG tablet Take 20 mg by mouth daily.   Yes [provider]  Tamsulosin HCl (FLOMAX) 0.4 MG CAPS Take 0.4 mg by mouth daily after supper.  03/29/11  Yes [provider]  verapamil (CALAN-SR) 240 MG CR tablet Take 240 mg by mouth daily.     Yes [provider]  HYDROcodone-acetaminophen (NORCO) 5-325 MG tablet Take 1-2 tablets by mouth every 6 (six) hours as needed. Patient not taking: Reported on 02/12/2018 11/23/16   11/25/16, PA-C    Allergies    Patient has no known allergies.   Review of Systems   Review of Systems  All other systems reviewed and are negative.   Physical Exam Updated Vital Signs BP (!) 151/118   Pulse 61   Temp 99.6 F (37.6 C) (Oral)   Resp 20   Ht 5\' 11"  (1.803 m)   Wt 127.5 kg   SpO2 93%   BMI 39.19 kg/m   Physical Exam Vitals and nursing note reviewed.  Constitutional:      Appearance: He is well-developed.  HENT:     Head: Normocephalic and atraumatic.  Cardiovascular:     Rate and Rhythm: Tachycardia present. Rhythm irregular.     Heart sounds: No murmur.  Pulmonary:     Effort: Pulmonary effort is normal. No respiratory distress.     Comments: Crackles in bilateral bases Abdominal:     Palpations: Abdomen is soft.     Tenderness: There is no abdominal tenderness. There is no guarding or rebound.  Musculoskeletal:        General: No tenderness.     Comments: 1+ edema to BLE  Skin:    General: Skin is warm and dry.  Neurological:     Mental Status: He is alert and oriented to person, place, and time.  Psychiatric:        Behavior: Behavior normal.     ED Results / Procedures / Treatments   Labs (all labs ordered are listed, but only abnormal results are displayed) Labs Reviewed  COMPREHENSIVE METABOLIC PANEL - Abnormal; Notable for the following components:      Result Value   Glucose, Bld 108 (*)    Calcium 7.9 (*)    Total Protein 6.0 (*)    Albumin 2.7 (*)    AST 47 (*)    ALT 50 (*)    All other components within normal limits  CBC WITH DIFFERENTIAL/PLATELET - Abnormal; Notable for the following components:   WBC 13.7 (*)    Neutro Abs 10.7 (*)    All other components within normal limits  D-DIMER, QUANTITATIVE (NOT AT [facility]) - Abnormal; Notable for the following components:   D-Dimer, Quant 0.66 (*)    All other components within normal limits  TROPONIN I (HIGH SENSITIVITY) - Abnormal; Notable for the following components:   Troponin I (High Sensitivity) 24 (*)    All other components within  normal limits  BRAIN NATRIURETIC PEPTIDE  MAGNESIUM    EKG None  Radiology DG Chest Port 1 View  Result Date: 12/30/2019 CLINICAL DATA:  Shortness of breath, recent COVID positive EXAM: PORTABLE CHEST 1 VIEW COMPARISON:  12/28/2019 FINDINGS: Low lung volumes. Persistent bilateral opacities, greatest at the left lung base. No significant pleural effusion. No pneumothorax. Stable cardiomediastinal contours. IMPRESSION: Similar appearance of bilateral opacities likely reflecting pneumonia. Electronically Signed  By: Macy Mis M.D.   On: 12/30/2019 11:11   DG Chest Portable 1 View  Result Date: 12/28/2019 CLINICAL DATA:  Congestion, cough, fever EXAM: PORTABLE CHEST 1 VIEW COMPARISON:  01/31/2018 FINDINGS: Single frontal view of the chest demonstrates a stable cardiac silhouette. Interval development of multifocal bilateral airspace disease greatest in the right upper and left lower lung zones. No effusion or pneumothorax. No acute bony abnormality. IMPRESSION: 1. Multifocal bilateral pneumonia. Electronically Signed   By: Randa Ngo M.D.   On: 12/28/2019 16:19    Procedures Procedures (including critical care time) CRITICAL CARE Performed by: Quintella Reichert   Total critical care time: 40 minutes  Critical care time was exclusive of separately billable procedures and treating other patients.  Critical care was necessary to treat or prevent imminent or life-threatening deterioration.  Critical care was time spent personally by me on the following activities: development of treatment plan with patient and/or surrogate as well as nursing, discussions with consultants, evaluation of patient's response to treatment, examination of patient, obtaining history from patient or surrogate, ordering and performing treatments and interventions, ordering and review of laboratory studies, ordering and review of radiographic studies, pulse oximetry and re-evaluation of patient's condition.   Medications Ordered in ED Medications  diltiazem (CARDIZEM) 1 mg/mL load via infusion 20 mg (20 mg Intravenous Bolus from Bag 12/30/19 1046)    And  diltiazem (CARDIZEM) 125 mg in dextrose 5% 125 mL (1 mg/mL) infusion (12 mg/hr Intravenous Rate/Dose Verify 12/30/19 1430)  acetaminophen (TYLENOL) tablet 650 mg (650 mg Oral Given 12/30/19 1033)    ED Course  I have reviewed the triage vital signs and the nursing notes.  Pertinent labs & imaging results that were available during my care of the patient were reviewed by me and considered in my medical decision making (see chart for details).    MDM Rules/Calculators/A&P                     Patient with recent diagnosis of COVID-19 here for evaluation of tachycardia. He is in atrial fibrillation with rapid ventricular response. He is relatively asymptomatic from this a fib with RVR, it is unclear when it truly started, therefore do not feel that he is a good direct cardioversion candidate. He was treated with Cardizem bolus and drip for rate control with persistent tachycardia to the 140s. Chest x-ray demonstrates bilateral infiltrates consistent with COVID-19 infection, personally reviewed. Discussed with patient new onset atrial fibrillation, hypoxia and recommendation for admission and he is in agreement treatment plan. Hospitalist consulted for admission.  BARD HAUPERT was evaluated in Emergency Department on 12/30/2019 for the symptoms described in the history of present illness. He was evaluated in the context of the global COVID-19 pandemic, which necessitated consideration that the patient might be at risk for infection with the SARS-CoV-2 virus that causes COVID-19. Institutional protocols and algorithms that pertain to the evaluation of patients at risk for COVID-19 are in a state of rapid change based on information released by regulatory bodies including the CDC and federal and state organizations. These policies and algorithms were  followed during the patient's care in the ED.  Final Clinical Impression(s) / ED Diagnoses Final diagnoses:  Atrial fibrillation with RVR ([place])  Pneumonia due to COVID-19 virus    Rx / DC Orders ED Discharge Orders    None       Quintella Reichert, MD 12/30/19 1515

## 2019-12-30 NOTE — ED Triage Notes (Signed)
Patient presents to ed via [facility] states he was dx. With covid on sat. And was going to the infusion clinic today drove himself was walking in to the clinic and became sob. Ems states his sats were 84 % on RA placed on 4 liters [place] and sats increased to 94 %. New onset a-fib uncontrolled. Ems states patient was having periods of 6-7 runs of V-Tachy. Patient denies chest pain or sob. At present.

## 2019-12-30 NOTE — Progress Notes (Addendum)
@  8110 patient arrived at the infusion clinic with shortness of breath. O2 sats 88 % on room air. Denies chest pain. Heart rate irregular on dinamap. Brachial pulse palpated >irregular 120's.140's.Placed on nasal cannula 4 liters , sats up to 94 percent. 20 G PIV  placed to left AC. Monoclonal antibody Not given to patient.  [phone] EMS arrived>will transport patient to [facility] ED

## 2019-12-30 NOTE — ED Notes (Signed)
Patient states he will call his wife

## 2019-12-30 NOTE — H&P (Addendum)
History and Physical    Jared Lopez DOB: 02/27/1966 DOA: 12/30/2019  Referring MD/NP/PA:  Quintella Reichert, MD PCP: Iona Beard, MD  Patient coming from: Via EMS  Chief Complaint: Shortness of breath  I have personally briefly reviewed patient's old medical records in [place]   HPI: Jared Lopez is a 54 y.o. male with medical history significant of hypertension, hyperlipidemia, obesity, BPH, and OSA on CPAP presents with complaints of shortness of breath.  He had not been feeling well over the last 1-2 weeks with reports of a dry cough.  Reported associated symptoms of fevers up to 102 F and generalized malaise.  Due to his symptoms he went to [place] 2 days ago and was diagnosed with COVID-19.  He had been scheduled to receive an outpatient infusion today, but on arrival was noted to be significantly short of breath.  At the infusion center patient was noted to be hypoxic down to 84% on room air, and was placed on 4 L nasal cannula oxygen.  He is also found to be in new onset atrial fibrillation.  He denies having any chest pain, nausea, vomiting, diarrhea, change in taste/appetite, calf pain, headache, or muscle aches.  Does note that his mother has a history of heart disease and atrial fibrillation. He had not received any of the covid -19 vaccine.   ED Course: Upon admission into the emergency department patient was seen to be febrile up to 100.4 F, heart rate 254 in atrial fibrillation, respirations 18-28, and O2 saturations as low as 88% with improvement to 95% on 4L of nasal cannula oxygen.  Labs significant for WBC 13.7, potassium 3.5, magnesium 1.8, AST 47, ALT 50,  and troponin 24.  Patient was given Tylenol and started on a diltiazem drip.  TRH called to admit.  Review of Systems  Constitutional: Positive for fever. Negative for malaise/fatigue.  HENT: Negative for congestion and nosebleeds.   Eyes: Negative for photophobia and pain.    Respiratory: Positive for cough and shortness of breath.   Cardiovascular: Positive for palpitations and leg swelling. Negative for chest pain.  Gastrointestinal: Negative for diarrhea, nausea and vomiting.  Genitourinary: Negative for dysuria and urgency.  Musculoskeletal: Negative for falls and joint pain.  Skin: Negative for itching and rash.  Neurological: Negative for focal weakness and loss of consciousness.  Psychiatric/Behavioral: Negative for memory loss and substance abuse.    Past Medical History:  Diagnosis Date  . Abdominal pain   . Arthritis   . Blood in stool   . Blurred vision   . Chronic back pain   . Constipation   . Difficulty urinating   . Enlarged prostate   . Generalized headaches    high blood pressure onset  . Hernia   . Hyperlipidemia   . Hypertension   . Mass of perirectal soft tissue   . Over weight   . Rectal bleeding   . Sleep apnea    uses CPAP nightly    Past Surgical History:  Procedure Laterality Date  . BACK SURGERY    . CARDIAC CATHETERIZATION  2013   Dr Einar Gip  . HERNIA REPAIR    . KNEE ARTHROSCOPY WITH MEDIAL MENISECTOMY Left 11/23/2016   Procedure: KNEE ARTHROSCOPY WITH MEDIAL AND LATERAL MENISECTOMY AND DEBRIEDMENT OF CONDROMALACIA  FEMUR;  Surgeon: Frederik Pear, MD;  Location: [place];  Service: Orthopedics;  Laterality: Left;  . LEFT HEART CATHETERIZATION WITH CORONARY ANGIOGRAM N/A 06/12/2012  Procedure: LEFT HEART CATHETERIZATION WITH CORONARY ANGIOGRAM;  Surgeon: Laverda Page, MD;  Location: [facility] CATH LAB;  Service: Cardiovascular;  Laterality: N/A;     reports that he has been smoking cigars. He has never used smokeless tobacco. He reports current alcohol use of about 1.0 - 2.0 standard drinks of alcohol per week. He reports that he does not use drugs.  No Known Allergies  Family History  Problem Relation Age of Onset  . Heart disease Mother   . Hypertension Mother     Prior to Admission medications    Medication Sig Start Date End Date Taking? Authorizing Provider  dicyclomine (BENTYL) 20 MG tablet Take 20 mg by mouth See admin instructions. 68m once daily 30 minutes before breakfast and then 232mat bedtime for spasmic colon. 12/13/19   [provider]  folic acid (FOLVITE) 1 MG tablet Take 1 mg by mouth daily. 12/13/19   [provider]  furosemide (LASIX) 40 MG tablet Take 40 mg by mouth daily. 12/02/19   [provider]  hydrochlorothiazide (HYDRODIURIL) 25 MG tablet Take 25 mg by mouth daily.    [provider]  HYDROcodone-acetaminophen (NORCO) 5-325 MG tablet Take 1-2 tablets by mouth every 6 (six) hours as needed. Patient not taking: Reported on 02/12/2018 11/23/16   PhLeighton ParodyPA-C  Tamsulosin HCl (FLOMAX) 0.4 MG CAPS Take 0.4 mg by mouth daily after supper.  03/29/11   [provider]  verapamil (CALAN-SR) 240 MG CR tablet Take 240 mg by mouth daily.      [provider]    Physical Exam:  Constitutional: Middle-age male who appears acutely ill Vitals:   12/30/19 1200 12/30/19 1230 12/30/19 1300 12/30/19 1330  BP: (!) 112/59 113/64 (!) 106/57 (!) 123/103  Pulse: 72 91 (!) 43 (!) 144  Resp: (!) 25 (!) 26 (!) 23 19  Temp:      TempSrc:      SpO2: 95% 95% 95% 94%  Weight:      Height:       Eyes: PERRL, lids and conjunctivae normal ENMT: Mucous membranes are moist. Posterior pharynx clear of any exudate or lesions.  Neck: normal, supple, no masses, no thyromegaly Respiratory: Crackles heard and both mid to lower lung fields.  Patient currently on 4 L nasal cannula oxygen and able to talk in relatively complete sentences. Cardiovascular: Irregular irregular, no murmurs / rubs / gallops.  + Bilateral lower extremity edema. 2+ pedal pulses. No carotid bruits.  Abdomen: no tenderness, no masses palpated. No hepatosplenomegaly. Bowel sounds positive.  Musculoskeletal: no clubbing / cyanosis. No joint deformity upper and  lower extremities. Good ROM, no contractures. Normal muscle tone.  Skin: no rashes, lesions, ulcers. No induration Neurologic: CN 2-12 grossly intact. Sensation intact, DTR normal. Strength 5/5 in all 4.  Psychiatric: Normal judgment and insight. Alert and oriented x 3. Normal mood.     Labs on Admission: I have personally reviewed following labs and imaging studies  CBC: Recent Labs  Lab 12/28/19 1643 12/30/19 1040  WBC 12.6* 13.7*  NEUTROABS 10.0* 10.7*  HGB 15.0 14.3  HCT 44.5 43.6  MCV 91.8 92.8  PLT 272 29675 Basic Metabolic Panel: Recent Labs  Lab 12/28/19 1643 12/30/19 1040  NA 135 139  K 3.4* 3.5  CL 101 102  CO2 24 26  GLUCOSE 139* 108*  BUN 10 9  CREATININE 1.09 1.23  CALCIUM 8.4* 7.9*  MG  --  1.8   GFR:  Estimated Creatinine Clearance: 94.5 mL/min (by C-G formula based on SCr of 1.23 mg/dL). Liver Function Tests: Recent Labs  Lab 12/30/19 1040  AST 47*  ALT 50*  ALKPHOS 50  BILITOT 1.2  PROT 6.0*  ALBUMIN 2.7*   No results for input(s): LIPASE, AMYLASE in the last 168 hours. No results for input(s): AMMONIA in the last 168 hours. Coagulation Profile: No results for input(s): INR, PROTIME in the last 168 hours. Cardiac Enzymes: No results for input(s): CKTOTAL, CKMB, CKMBINDEX, TROPONINI in the last 168 hours. BNP (last 3 results) No results for input(s): PROBNP in the last 8760 hours. HbA1C: No results for input(s): HGBA1C in the last 72 hours. CBG: No results for input(s): GLUCAP in the last 168 hours. Lipid Profile: No results for input(s): CHOL, HDL, LDLCALC, TRIG, CHOLHDL, LDLDIRECT in the last 72 hours. Thyroid Function Tests: No results for input(s): TSH, T4TOTAL, FREET4, T3FREE, THYROIDAB in the last 72 hours. Anemia Panel: No results for input(s): VITAMINB12, FOLATE, FERRITIN, TIBC, IRON, RETICCTPCT in the last 72 hours. Urine analysis:    Component Value Date/Time   COLORURINE YELLOW 11/17/2009 1620   APPEARANCEUR CLEAR  11/17/2009 1620   LABSPEC 1.036 (H) 11/17/2009 1620   PHURINE 5.5 11/17/2009 1620   GLUCOSEU NEGATIVE 11/17/2009 1620   HGBUR SMALL (A) 11/17/2009 1620   BILIRUBINUR NEGATIVE 11/17/2009 1620   KETONESUR NEGATIVE 11/17/2009 1620   PROTEINUR NEGATIVE 11/17/2009 1620   UROBILINOGEN 0.2 11/17/2009 1620   NITRITE NEGATIVE 11/17/2009 1620   LEUKOCYTESUR NEGATIVE 11/17/2009 1620   Sepsis Labs: Recent Results (from the past 240 hour(s))  SARS Coronavirus 2 Ag (30 min TAT) - Nasal Swab (BD Veritor Kit)     Status: Abnormal   Collection Time: 12/28/19  3:33 PM   Specimen: Nasal Swab (BD Veritor Kit)  Result Value Ref Range Status   SARS Coronavirus 2 Ag POSITIVE (A) NEGATIVE Final    Comment: RESULT CALLED TO, READ BACK BY AND VERIFIED WITH: PEGRAM JERI RN [id number] PHILLIPS C (NOTE) SARS-CoV-2 antigen PRESENT. Positive results indicate the presence of viral antigens, but clinical correlation with patient history and other diagnostic information is necessary to determine patient infection status.  Positive results do not rule out bacterial infection or co-infection  with other viruses. False positive results are rare but can occur, and confirmatory RT-PCR testing may be appropriate in some circumstances. The expected result is Negative. Fact Sheet for Patients: PodPark.tn Fact Sheet for Providers: GiftContent.is  This test is not yet approved or cleared by the [place] FDA and  has been authorized for detection and/or diagnosis of SARS-CoV-2 by FDA under an Emergency Use Authorization (EUA).  This EUA will remain in effect (meaning this test can be used) for the duration of  the COVID-19 decla ration under Section 564(b)(1) of the Act, 21 U.S.C. section 360bbb-3(b)(1), unless the authorization is terminated or revoked sooner. Performed at [facility], [address] [place], [place] [address]       Radiological Exams on Admission: DG Chest [facility] 1 View  Result Date: 12/30/2019 CLINICAL DATA:  Shortness of breath, recent COVID positive EXAM: PORTABLE CHEST 1 VIEW COMPARISON:  12/28/2019 FINDINGS: Low lung volumes. Persistent bilateral opacities, greatest at the left lung base. No significant pleural effusion. No pneumothorax. Stable cardiomediastinal contours. IMPRESSION: Similar appearance of bilateral opacities likely reflecting pneumonia. Electronically Signed   By: Macy Mis M.D.   On: 12/30/2019 11:11   DG Chest Portable 1 View  Result Date:  12/28/2019 CLINICAL DATA:  Congestion, cough, fever EXAM: PORTABLE CHEST 1 VIEW COMPARISON:  01/31/2018 FINDINGS: Single frontal view of the chest demonstrates a stable cardiac silhouette. Interval development of multifocal bilateral airspace disease greatest in the right upper and left lower lung zones. No effusion or pneumothorax. No acute bony abnormality. IMPRESSION: 1. Multifocal bilateral pneumonia. Electronically Signed   By: Randa Ngo M.D.   On: 12/28/2019 16:19    EKG: Independently reviewed. Atrial fibrillation at 176 bpm  Assessment/Plan A. fib with RVR: Acute.  Patient presents in A. fib with RVR with heart rates into the 180s.  Atrial fibrillation in the past.  Patient had been started on a Cardizem drip in the ED.  CHA2DS2-VASc score = 1(HTN).   -Admit to a progressive bed  -Add on TSH -Goal potassium 4, magnesium 2 -Continue Cardizem gtt -Check echocardiogram -Consider formally consulting cardiology if heart rates remain uncontrolled in a.m.  Acute respiratory failure with hypoxia secondary to pneumonia due to COVID-19: Patient was diagnosed with COVID-19.  He had gone to the infusion center today, but was directed to the ED due to severity of his symptoms. Oxygen saturations were as low as 84%, but improved >90% on 4 L of nasal cannula oxygen. Chest x-ray noting a multifocal pneumonia.   -COVID-19 order set  utilized -Continuous pulse oximetry with nasal cannula oxygen as needed to maintain O2 saturation greater than 90% -Albuterol inhaler -Remdesivir per pharmacy -Decadron 6 mg daily  -Vitamin C and zinc -Antitussives as needed -Check inflammatory markers stat  Elevated troponin: Acute.  Initial troponin mildly elevated at 24.  EKG without patient denies any reports of chest pain.  Suspect secondary to demand. -Continue to monitor  Essential hypertension: Blood pressure currently maintained. -Consider restarting/adjust dose of home blood pressure medication once reconciled  Elevated liver enzymes: Acute.  AST 47 and ALT 50. -Continue to monitor  Lower extremity swelling: Patient with 1+ pitting edema of the bilateral lower extremities that he reports is chronic.  BNP within normal limits. -Continue to monitor  OSA on sleep -CPAP nightly  BPH -Continue Flomax  Obesity: BMI 39.19 kg/m  DVT prophylaxis: lovenox Code Status: Full Family Communication: Wife updated over the phone Disposition Plan: Likely discharge home in 2-5 days if medically stable Consults called: none Admission status: Inpatient  Norval Morton MD [facility] Pager [phone]   If 7PM-7AM, please contact night-coverage [email or web address] Password [facility]  12/30/2019, 2:44 PM

## 2019-12-31 ENCOUNTER — Other Ambulatory Visit: Payer: Self-pay

## 2019-12-31 ENCOUNTER — Encounter (HOSPITAL_COMMUNITY): Payer: Self-pay | Admitting: Internal Medicine

## 2019-12-31 ENCOUNTER — Inpatient Hospital Stay (HOSPITAL_COMMUNITY): Payer: 59

## 2019-12-31 DIAGNOSIS — I4891 Unspecified atrial fibrillation: Secondary | ICD-10-CM | POA: Diagnosis not present

## 2019-12-31 LAB — CBC WITH DIFFERENTIAL/PLATELET
Abs Immature Granulocytes: 0.05 10*3/uL (ref 0.00–0.07)
Basophils Absolute: 0 10*3/uL (ref 0.0–0.1)
Basophils Relative: 0 %
Eosinophils Absolute: 0 10*3/uL (ref 0.0–0.5)
Eosinophils Relative: 0 %
HCT: 44.5 % (ref 39.0–52.0)
Hemoglobin: 14.6 g/dL (ref 13.0–17.0)
Immature Granulocytes: 0 %
Lymphocytes Relative: 7 %
Lymphs Abs: 0.8 10*3/uL (ref 0.7–4.0)
MCH: 30.6 pg (ref 26.0–34.0)
MCHC: 32.8 g/dL (ref 30.0–36.0)
MCV: 93.3 fL (ref 80.0–100.0)
Monocytes Absolute: 0.6 10*3/uL (ref 0.1–1.0)
Monocytes Relative: 5 %
Neutro Abs: 10 10*3/uL — ABNORMAL HIGH (ref 1.7–7.7)
Neutrophils Relative %: 88 %
Platelets: 278 10*3/uL (ref 150–400)
RBC: 4.77 MIL/uL (ref 4.22–5.81)
RDW: 14 % (ref 11.5–15.5)
WBC: 11.4 10*3/uL — ABNORMAL HIGH (ref 4.0–10.5)
nRBC: 0 % (ref 0.0–0.2)

## 2019-12-31 LAB — COMPREHENSIVE METABOLIC PANEL
ALT: 47 U/L — ABNORMAL HIGH (ref 0–44)
AST: 31 U/L (ref 15–41)
Albumin: 2.8 g/dL — ABNORMAL LOW (ref 3.5–5.0)
Alkaline Phosphatase: 55 U/L (ref 38–126)
Anion gap: 13 (ref 5–15)
BUN: 9 mg/dL (ref 6–20)
CO2: 25 mmol/L (ref 22–32)
Calcium: 8.3 mg/dL — ABNORMAL LOW (ref 8.9–10.3)
Chloride: 100 mmol/L (ref 98–111)
Creatinine, Ser: 0.89 mg/dL (ref 0.61–1.24)
GFR calc Af Amer: >60 mL/min (ref >60)
GFR calc non Af Amer: >60 mL/min (ref >60)
Glucose, Bld: 141 mg/dL — ABNORMAL HIGH (ref 70–99)
Potassium: 3.7 mmol/L (ref 3.5–5.1)
Sodium: 138 mmol/L (ref 135–145)
Total Bilirubin: 0.9 mg/dL (ref 0.3–1.2)
Total Protein: 6.7 g/dL (ref 6.5–8.1)

## 2019-12-31 LAB — ECHOCARDIOGRAM LIMITED
Height: 70.984 in
Weight: 4440 oz

## 2019-12-31 LAB — PROCALCITONIN: Procalcitonin: <0.1 ng/mL

## 2019-12-31 LAB — FERRITIN: Ferritin: 682 ng/mL — ABNORMAL HIGH (ref 24–336)

## 2019-12-31 LAB — PHOSPHORUS: Phosphorus: 3.2 mg/dL (ref 2.5–4.6)

## 2019-12-31 LAB — BRAIN NATRIURETIC PEPTIDE: B Natriuretic Peptide: 56.1 pg/mL (ref 0.0–100.0)

## 2019-12-31 LAB — D-DIMER, QUANTITATIVE: D-Dimer, Quant: 0.95 ug/mL-FEU — ABNORMAL HIGH (ref 0.00–0.50)

## 2019-12-31 LAB — MAGNESIUM: Magnesium: 2.1 mg/dL (ref 1.7–2.4)

## 2019-12-31 LAB — C-REACTIVE PROTEIN: CRP: 12.9 mg/dL — ABNORMAL HIGH (ref <1.0)

## 2019-12-31 MED ORDER — POTASSIUM CHLORIDE CRYS ER 20 MEQ PO TBCR
40.0000 meq | EXTENDED_RELEASE_TABLET | Freq: Once | ORAL | Status: AC
  Administered 2019-12-31: 40 meq via ORAL
  Filled 2019-12-31: qty 2

## 2019-12-31 MED ORDER — ASPIRIN 81 MG PO CHEW
81.0000 mg | CHEWABLE_TABLET | Freq: Every day | ORAL | Status: DC
  Administered 2019-12-31 – 2020-01-01 (×2): 81 mg via ORAL
  Filled 2019-12-31 (×2): qty 1

## 2019-12-31 MED ORDER — PERFLUTREN LIPID MICROSPHERE
1.0000 mL | INTRAVENOUS | Status: AC | PRN
  Administered 2019-12-31: 5 mL via INTRAVENOUS
  Filled 2019-12-31: qty 10

## 2019-12-31 MED ORDER — FUROSEMIDE 40 MG PO TABS
40.0000 mg | ORAL_TABLET | Freq: Once | ORAL | Status: AC
  Administered 2019-12-31: 40 mg via ORAL
  Filled 2019-12-31: qty 1

## 2019-12-31 MED ORDER — DILTIAZEM HCL 60 MG PO TABS
60.0000 mg | ORAL_TABLET | Freq: Three times a day (TID) | ORAL | Status: DC
  Administered 2019-12-31 – 2020-01-01 (×4): 60 mg via ORAL
  Filled 2019-12-31 (×4): qty 1

## 2019-12-31 MED ORDER — ENOXAPARIN SODIUM 60 MG/0.6ML ~~LOC~~ SOLN
60.0000 mg | SUBCUTANEOUS | Status: DC
  Administered 2019-12-31: 60 mg via SUBCUTANEOUS
  Filled 2019-12-31: qty 0.6

## 2019-12-31 MED ORDER — ISOSORBIDE MONONITRATE 20 MG PO TABS
20.0000 mg | ORAL_TABLET | Freq: Every day | ORAL | Status: DC
  Administered 2019-12-31 – 2020-01-05 (×6): 20 mg via ORAL
  Filled 2019-12-31 (×6): qty 1

## 2019-12-31 MED FILL — Perflutren Lipid Microsphere IV Susp 1.1 MG/ML: INTRAVENOUS | Qty: 10 | Status: AC

## 2019-12-31 NOTE — Progress Notes (Signed)
  CPAP set up on auto-titrate settings (max 18.0,min 6.0) cm H2O, with FFM.  Placed mask for patient comfort.  Patient states hes not ready to wear CPAP at this time. RN at bedside and aware. RN and or patient to RT call for assistance if needed.

## 2019-12-31 NOTE — Progress Notes (Signed)
CPAP set up at bedside. Patient states when/if he is ready to wear CPAP he will call for assistance.

## 2019-12-31 NOTE — Plan of Care (Signed)

## 2019-12-31 NOTE — Progress Notes (Signed)
PROGRESS NOTE                                                                                                                                                                                                             Patient Demographics:    Jared Lopez, is a 54 y.o. male, DOB - 1966-04-21, WJX:[id number]  Outpatient Primary MD for the patient is Iona Beard, MD    LOS - 1  Admit date - 12/30/2019    Chief Complaint  Patient presents with  . Atrial Fibrillation  . Covid Exposure       Brief Narrative  Jared Lopez is a 54 y.o. male with medical history significant of hypertension, hyperlipidemia, obesity, BPH, and OSA on CPAP presents with complaints of shortness of breath.  He had not been feeling well over the last 1-2 weeks with reports of a dry cough, in the ER he was diagnosed with COVID-19 pneumonia along with A. fib RVR and admitted to the hospital.   Subjective:    Jared Lopez today has, No headache, No chest pain, No abdominal pain - No Nausea, No new weakness tingling or numbness, mild cough no shortness of breath.   Assessment  & Plan :      1.  Acute Covid 19 Viral Pneumonitis during the ongoing 2020 Covid 19 Pandemic - he has mild to moderate disease has been started on steroids and remdesivir, continue and finish the course, clinically seems to be stable.  Will advance activity and monitor oxygen demand.  Encouraged the patient to sit up in chair in the daytime use I-S and flutter valve for pulmonary toiletry and then prone in bed when at night.     SpO2: 90 % O2 Flow Rate (L/min): 2 L/min  Recent Labs  Lab 12/30/19 1040 12/30/19 1211 12/30/19 1645 12/31/19 0357  CRP  --   --   --  12.9*  DDIMER  --  0.66* 0.60* 0.95*  BNP 48.3  --   --  56.1  PROCALCITON  --   --  <0.10  --     Hepatic Function Latest Ref Rng & Units 12/31/2019 12/30/2019 07/25/2016  Total Protein 6.5 - 8.1 g/dL 6.7  6.0(L) 8.7(H)  Albumin 3.5 - 5.0 g/dL 2.8(L) 2.7(L) 4.2  AST 15 - 41 U/L 31 47(H) 24  ALT  0 - 44 U/L 47(H) 50(H) 29  Alk Phosphatase 38 - 126 U/L 55 50 97  Total Bilirubin 0.3 - 1.2 mg/dL 0.9 1.2 0.6     2.  Paroxysmal A. fib versus new onset A. fib RVR.  Mali vas 2 score of 0 -1 = he was initially placed on Cardizem drip, currently seems to be in rate control will be transition to oral Cardizem along with 81 mg of aspirin.  TSH stable echo pending.  Seen by cardiology.  He says that he might have been told that he has dilated heart but does not know the specifics.  Recheck echo.  Could have underlying diastolic CHF.  Mild non-ACS pattern troponin rise was due to RVR.  Monitor.  No acute issues.   3.  Mild COVID-19 viral infection related transaminitis.  Asymptomatic with stable trend.  Monitor.    4. Obesity.  BMI 38.  Follow with PCP.  5.  BPH.  On Flomax.  6.  OSA.  CPAP at night.  7.  Lower extremity edema.  Gentle diuresis.   Condition - Fair  Family Communication  : Bjorn Loser [phone] on 12/31/2019  Code Status :  Full  Diet :   Diet Order            Diet Heart Room service appropriate? Yes; Fluid consistency: Thin  Diet effective now               Disposition Plan  :  Stay inpatient for treatment of COVID-19 pneumonia with IV REMDESIVIR  Consults  :  Cards  Procedures  :    TTE  PUD Prophylaxis :   DVT Prophylaxis  :  Lovenox    Lab Results  Component Value Date   PLT 278 12/31/2019    Inpatient Medications  Scheduled Meds: . albuterol  2 puff Inhalation Q6H  . vitamin C  500 mg Oral Daily  . aspirin  81 mg Oral Daily  . dexamethasone  6 mg Oral Q24H  . diltiazem  60 mg Oral Q8H  . enoxaparin (LOVENOX) injection  40 mg Subcutaneous Q24H  . famotidine  20 mg Oral BID  . isosorbide mononitrate  20 mg Oral Daily  . sodium chloride flush  3 mL Intravenous Q12H  . tamsulosin  0.4 mg Oral PC supper  . zinc sulfate  220 mg Oral Daily    Continuous Infusions: . diltiazem (CARDIZEM) infusion 10 mg/hr (12/31/19 0958)  . remdesivir 100 mg in NS 100 mL 100 mg (12/31/19 1013)   PRN Meds:.acetaminophen, chlorpheniramine-HYDROcodone, guaiFENesin-dextromethorphan, ondansetron **OR** ondansetron (ZOFRAN) IV  Antibiotics  :    Anti-infectives (From admission, onward)   Start     Dose/Rate Route Frequency Ordered Stop   12/31/19 1000  remdesivir 100 mg in sodium chloride 0.9 % 100 mL IVPB     100 mg 200 mL/hr over 30 Minutes Intravenous Daily 12/30/19 1529 01/04/20 0959   12/30/19 1630  remdesivir 200 mg in sodium chloride 0.9% 250 mL IVPB     200 mg 580 mL/hr over 30 Minutes Intravenous Once 12/30/19 1529 12/30/19 1717       Time Spent in minutes  30   Lala Lund M.D on 12/31/2019 at 10:21 AM  To page go to [email or web address] - password [name]  [facility] -  Office  [phone]     See all Orders from today for further details    Objective:   Vitals:   12/31/19 0150 12/31/19 0313 12/31/19 0350 12/31/19  0637  BP: 116/73  110/70 119/67  Pulse: 81  81 79  Resp: (!) 28  18 (!) 21  Temp: 98.7 F (37.1 C)  97.8 F (36.6 C) 98.2 F (36.8 C)  TempSrc: Oral  Oral   SpO2: (!) 89% 92% 90% 90%  Weight: 125.9 kg     Height: 5' 10.98" (1.803 m)       Wt Readings from Last 3 Encounters:  12/31/19 125.9 kg  11/23/16 126.9 kg  07/25/16 127.9 kg     Intake/Output Summary (Last 24 hours) at 12/31/2019 1021 Last data filed at 12/31/2019 0902 Gross per 24 hour  Intake 1278.84 ml  Output 225 ml  Net 1053.84 ml     Physical Exam  Awake Alert, No new F.N deficits, Normal affect [place].AT,PERRAL Supple Neck,No JVD, No cervical lymphadenopathy appriciated.  Symmetrical Chest wall movement, Good air movement bilaterally, CTAB iRRR,No Gallops,Rubs or new Murmurs, No Parasternal Heave +ve B.Sounds, Abd Soft, No tenderness, No organomegaly appriciated, No rebound - guarding or rigidity. No Cyanosis, Clubbing or  edema, No new Rash or bruise     Data Review:    CBC Recent Labs  Lab 12/28/19 1643 12/30/19 1040 12/31/19 0357  WBC 12.6* 13.7* 11.4*  HGB 15.0 14.3 14.6  HCT 44.5 43.6 44.5  PLT 272 297 278  MCV 91.8 92.8 93.3  MCH 30.9 30.4 30.6  MCHC 33.7 32.8 32.8  RDW 14.0 13.7 14.0  LYMPHSABS 1.2 2.0 0.8  MONOABS 1.3* 1.0 0.6  EOSABS 0.0 0.0 0.0  BASOSABS 0.1 0.0 0.0    Chemistries  Recent Labs  Lab 12/28/19 1643 12/30/19 1040 12/30/19 1645 12/31/19 0357  NA 135 139  --  138  K 3.4* 3.5  --  3.7  CL 101 102  --  100  CO2 24 26  --  25  GLUCOSE 139* 108*  --  141*  BUN 10 9  --  9  CREATININE 1.09 1.23  --  0.89  CALCIUM 8.4* 7.9*  --  8.3*  AST  --  47*  --  31  ALT  --  50*  --  47*  ALKPHOS  --  50  --  55  BILITOT  --  1.2  --  0.9  MG  --  1.8  --  2.1  TSH  --   --  1.417  --      ------------------------------------------------------------------------------------------------------------------ No results for input(s): CHOL, HDL, LDLCALC, TRIG, CHOLHDL, LDLDIRECT in the last 72 hours.  No results found for: HGBA1C ------------------------------------------------------------------------------------------------------------------ Recent Labs    12/30/19 1645  TSH 1.417    Cardiac Enzymes No results for input(s): CKMB, TROPONINI, MYOGLOBIN in the last 168 hours.  Invalid input(s): CK ------------------------------------------------------------------------------------------------------------------    Component Value Date/Time   BNP 56.1 12/31/2019 0357    Micro Results Recent Results (from the past 240 hour(s))  SARS Coronavirus 2 Ag (30 min TAT) - Nasal Swab (BD Veritor Kit)     Status: Abnormal   Collection Time: 12/28/19  3:33 PM   Specimen: Nasal Swab (BD Veritor Kit)  Result Value Ref Range Status   SARS Coronavirus 2 Ag POSITIVE (A) NEGATIVE Final    Comment: RESULT CALLED TO, READ BACK BY AND VERIFIED WITH: PEGRAM JERI RN [phone] [id number] PHILLIPS  C (NOTE) SARS-CoV-2 antigen PRESENT. Positive results indicate the presence of viral antigens, but clinical correlation with patient history and other diagnostic information is necessary to determine patient infection status.  Positive results do not  rule out bacterial infection or co-infection  with other viruses. False positive results are rare but can occur, and confirmatory RT-PCR testing may be appropriate in some circumstances. The expected result is Negative. Fact Sheet for Patients: PodPark.tn Fact Sheet for Providers: GiftContent.is  This test is not yet approved or cleared by the [place] FDA and  has been authorized for detection and/or diagnosis of SARS-CoV-2 by FDA under an Emergency Use Authorization (EUA).  This EUA will remain in effect (meaning this test can be used) for the duration of  the COVID-19 decla ration under Section 564(b)(1) of the Act, 21 U.S.C. section 360bbb-3(b)(1), unless the authorization is terminated or revoked sooner. Performed at [facility], [address], [place], [place] [address]     Radiology Reports DG Chest [place] 1 View  Result Date: 12/30/2019 CLINICAL DATA:  Shortness of breath, recent COVID positive EXAM: PORTABLE CHEST 1 VIEW COMPARISON:  12/28/2019 FINDINGS: Low lung volumes. Persistent bilateral opacities, greatest at the left lung base. No significant pleural effusion. No pneumothorax. Stable cardiomediastinal contours. IMPRESSION: Similar appearance of bilateral opacities likely reflecting pneumonia. Electronically Signed   By: Macy Mis M.D.   On: 12/30/2019 11:11   DG Chest Portable 1 View  Result Date: 12/28/2019 CLINICAL DATA:  Congestion, cough, fever EXAM: PORTABLE CHEST 1 VIEW COMPARISON:  01/31/2018 FINDINGS: Single frontal view of the chest demonstrates a stable cardiac silhouette. Interval development of multifocal bilateral airspace disease  greatest in the right upper and left lower lung zones. No effusion or pneumothorax. No acute bony abnormality. IMPRESSION: 1. Multifocal bilateral pneumonia. Electronically Signed   By: Randa Ngo M.D.   On: 12/28/2019 16:19

## 2019-12-31 NOTE — Progress Notes (Addendum)
  Echocardiogram 2D Echocardiogram has been performed with Definity.  Gerda Diss 12/31/2019, 12:15 PM

## 2020-01-01 ENCOUNTER — Inpatient Hospital Stay (HOSPITAL_COMMUNITY): Payer: 59

## 2020-01-01 LAB — COMPREHENSIVE METABOLIC PANEL
ALT: 61 U/L — ABNORMAL HIGH (ref 0–44)
AST: 46 U/L — ABNORMAL HIGH (ref 15–41)
Albumin: 2.7 g/dL — ABNORMAL LOW (ref 3.5–5.0)
Alkaline Phosphatase: 56 U/L (ref 38–126)
Anion gap: 9 (ref 5–15)
BUN: 8 mg/dL (ref 6–20)
CO2: 25 mmol/L (ref 22–32)
Calcium: 8.6 mg/dL — ABNORMAL LOW (ref 8.9–10.3)
Chloride: 104 mmol/L (ref 98–111)
Creatinine, Ser: 0.86 mg/dL (ref 0.61–1.24)
GFR calc Af Amer: >60 mL/min (ref >60)
GFR calc non Af Amer: >60 mL/min (ref >60)
Glucose, Bld: 152 mg/dL — ABNORMAL HIGH (ref 70–99)
Potassium: 4.2 mmol/L (ref 3.5–5.1)
Sodium: 138 mmol/L (ref 135–145)
Total Bilirubin: 0.7 mg/dL (ref 0.3–1.2)
Total Protein: 6.8 g/dL (ref 6.5–8.1)

## 2020-01-01 LAB — CBC WITH DIFFERENTIAL/PLATELET
Abs Immature Granulocytes: 0.09 10*3/uL — ABNORMAL HIGH (ref 0.00–0.07)
Basophils Absolute: 0 10*3/uL (ref 0.0–0.1)
Basophils Relative: 0 %
Eosinophils Absolute: 0 10*3/uL (ref 0.0–0.5)
Eosinophils Relative: 0 %
HCT: 45 % (ref 39.0–52.0)
Hemoglobin: 14.8 g/dL (ref 13.0–17.0)
Immature Granulocytes: 1 %
Lymphocytes Relative: 10 %
Lymphs Abs: 1 10*3/uL (ref 0.7–4.0)
MCH: 30.3 pg (ref 26.0–34.0)
MCHC: 32.9 g/dL (ref 30.0–36.0)
MCV: 92 fL (ref 80.0–100.0)
Monocytes Absolute: 0.8 10*3/uL (ref 0.1–1.0)
Monocytes Relative: 8 %
Neutro Abs: 9 10*3/uL — ABNORMAL HIGH (ref 1.7–7.7)
Neutrophils Relative %: 81 %
Platelets: 286 10*3/uL (ref 150–400)
RBC: 4.89 MIL/uL (ref 4.22–5.81)
RDW: 13.7 % (ref 11.5–15.5)
WBC: 11 10*3/uL — ABNORMAL HIGH (ref 4.0–10.5)
nRBC: 0 % (ref 0.0–0.2)

## 2020-01-01 LAB — D-DIMER, QUANTITATIVE: D-Dimer, Quant: 0.74 ug/mL-FEU — ABNORMAL HIGH (ref 0.00–0.50)

## 2020-01-01 LAB — PROCALCITONIN: Procalcitonin: <0.1 ng/mL

## 2020-01-01 LAB — GLUCOSE, CAPILLARY
Glucose-Capillary: 172 mg/dL — ABNORMAL HIGH (ref 70–99)
Glucose-Capillary: 132 mg/dL — ABNORMAL HIGH (ref 70–99)

## 2020-01-01 LAB — BRAIN NATRIURETIC PEPTIDE: B Natriuretic Peptide: 36.9 pg/mL (ref 0.0–100.0)

## 2020-01-01 LAB — C-REACTIVE PROTEIN: CRP: 8 mg/dL — ABNORMAL HIGH (ref <1.0)

## 2020-01-01 LAB — MAGNESIUM: Magnesium: 2.2 mg/dL (ref 1.7–2.4)

## 2020-01-01 MED ORDER — INSULIN ASPART 100 UNIT/ML ~~LOC~~ SOLN
0.0000 [IU] | Freq: Four times a day (QID) | SUBCUTANEOUS | Status: DC
  Administered 2020-01-01: 2 [IU] via SUBCUTANEOUS
  Administered 2020-01-01: 1 [IU] via SUBCUTANEOUS
  Administered 2020-01-02 (×2): 2 [IU] via SUBCUTANEOUS
  Administered 2020-01-02: 1 [IU] via SUBCUTANEOUS
  Administered 2020-01-02 – 2020-01-03 (×4): 2 [IU] via SUBCUTANEOUS
  Administered 2020-01-03: 3 [IU] via SUBCUTANEOUS
  Administered 2020-01-04 (×2): 2 [IU] via SUBCUTANEOUS
  Administered 2020-01-04 – 2020-01-05 (×5): 1 [IU] via SUBCUTANEOUS

## 2020-01-01 MED ORDER — FUROSEMIDE 10 MG/ML IJ SOLN
60.0000 mg | Freq: Once | INTRAMUSCULAR | Status: AC
  Administered 2020-01-01: 60 mg via INTRAVENOUS
  Filled 2020-01-01: qty 6

## 2020-01-01 MED ORDER — APIXABAN 5 MG PO TABS
5.0000 mg | ORAL_TABLET | Freq: Two times a day (BID) | ORAL | Status: DC
  Administered 2020-01-01 – 2020-01-06 (×11): 5 mg via ORAL
  Filled 2020-01-01 (×11): qty 1

## 2020-01-01 MED ORDER — METOPROLOL TARTRATE 50 MG PO TABS
50.0000 mg | ORAL_TABLET | Freq: Two times a day (BID) | ORAL | Status: DC
  Administered 2020-01-01 – 2020-01-05 (×9): 50 mg via ORAL
  Filled 2020-01-01 (×9): qty 1

## 2020-01-01 MED ORDER — METHYLPREDNISOLONE SODIUM SUCC 125 MG IJ SOLR
60.0000 mg | Freq: Two times a day (BID) | INTRAMUSCULAR | Status: DC
  Administered 2020-01-01 – 2020-01-04 (×7): 60 mg via INTRAVENOUS
  Filled 2020-01-01 (×7): qty 2

## 2020-01-01 MED ORDER — DILTIAZEM HCL 60 MG PO TABS: 90.0000 mg | ORAL_TABLET | Freq: Three times a day (TID) | ORAL | Status: DC

## 2020-01-01 MED ORDER — TOCILIZUMAB 400 MG/20ML IV SOLN
800.0000 mg | Freq: Once | INTRAVENOUS | Status: AC
  Administered 2020-01-01: 800 mg via INTRAVENOUS
  Filled 2020-01-01: qty 40

## 2020-01-01 NOTE — Progress Notes (Signed)
PROGRESS NOTE                                                                                                                                                                                                             Patient Demographics:    Jared Lopez, is a 54 y.o. male, DOB - 02/15/1966, GUY:[id number]  Outpatient Primary MD for the patient is Iona Beard, MD    LOS - 2  Admit date - 12/30/2019    Chief Complaint  Patient presents with  . Atrial Fibrillation  . Covid Exposure       Brief Narrative  Jared Lopez is a 54 y.o. male with medical history significant of hypertension, hyperlipidemia, obesity, BPH, and OSA on CPAP presents with complaints of shortness of breath.  He had not been feeling well over the last 1-2 weeks with reports of a dry cough, in the ER he was diagnosed with COVID-19 pneumonia along with A. fib RVR and admitted to the hospital.   Subjective:   Patient in bed, appears comfortable, denies any headache, no fever, no chest pain or pressure, ++ shortness of breath , no abdominal pain. No focal weakness.   Assessment  & Plan :     1.  Acute Covid 19 Viral Pneumonitis during the ongoing 2020 Covid 19 Pandemic - he has severe disease and his clinical course worsened despite being on IV steroids and remdesivir, now requiring 10 L nasal cannula oxygen with worsening chest x-ray, in addition to IV steroids and remdesivir he has been given IV Actemra after consent on 01/01/2020.    Encouraged the patient to sit up in chair in the daytime use I-S and flutter valve for pulmonary toiletry and then prone in bed when at night.     Actemra off label use - patient was told that if COVID-19 pneumonitis gets worse we might potentially use Actemra off label, she denies any known history of active diverticulitis, tuberculosis or hepatitis, no active Diverticulitis or known other active infections, understands  the risks and benefits and wants to proceed with Actemra treatment if required.  SpO2: 90 % O2 Flow Rate (L/min): 10 L/min  Recent Labs  Lab 12/30/19 1040 12/30/19 1211 12/30/19 1645 12/31/19 0357 01/01/20 0525  CRP  --   --   --  12.9* 8.0*  DDIMER  --  0.66* 0.60* 0.95* 0.74*  BNP 48.3  --   --  56.1 36.9  PROCALCITON  --   --  <0.10 <0.10 <0.10    Hepatic Function Latest Ref Rng & Units 01/01/2020 12/31/2019 12/30/2019  Total Protein 6.5 - 8.1 g/dL 6.8 6.7 6.0(L)  Albumin 3.5 - 5.0 g/dL 2.7(L) 2.8(L) 2.7(L)  AST 15 - 41 U/L 46(H) 31 47(H)  ALT 0 - 44 U/L 61(H) 47(H) 50(H)  Alk Phosphatase 38 - 126 U/L 56 55 50  Total Bilirubin 0.3 - 1.2 mg/dL 0.7 0.9 1.2   Lab Results  Component Value Date   TSH 1.417 12/30/2019    2.  Paroxysmal A. fib versus new onset A. fib RVR.  Mali vas 2 score of  2 -  he was initially placed on Cardizem drip, rate is now well controlled but his EF has come back around 40%, he also seems to have undiagnosed hypertension increasing his chads vas 2 score to 2.  I will place him on oral Lopressor instead of Cardizem for rate control, will start him on Eliquis.  Outpatient cardiology follow-up.   3.  Mild COVID-19 viral infection related transaminitis.  Asymptomatic with stable trend.  Monitor.    4. Obesity.  BMI 38.  Follow with PCP.  5.  BPH.  On Flomax.  6.  OSA.  CPAP at night.  7.  Mild acute on chronic systolic and diastolic CHF EF around 92%.  Continue beta-blocker along with Lasix for diuresis as needed, will give IV Lasix on 01/01/2020, post discharge will require cardiology evaluation, his primary cardiologist is Dr. Einar Gip.  Note patient states that he had left heart catheterization several years ago and that looked stable.    Condition - Fair  Family Communication  : Bjorn Loser [phone] on 12/31/2019, 01/01/20  Code Status :  Full  Diet :   Diet Order            Diet Heart Room service appropriate? Yes; Fluid consistency: Thin   Diet effective now               Disposition Plan  :  Stay inpatient for treatment of COVID-19 pneumonia with IV REMDESIVIR  Consults  :  Cards  Procedures  :    TTE -   1. Definity used; mild to moderate global reduction in LV systolic function; grade 2 diastolic dysfunction; mild LVE; mild LVH.  2. Left ventricular ejection fraction, by estimation, is 40 to 45%. The left ventricle has mildly decreased function. The left ventricle demonstrates global hypokinesis. The left ventricular internal cavity size was mildly dilated. There is mild left ventricular hypertrophy. Left ventricular diastolic parameters are consistent with Grade II diastolic dysfunction (pseudonormalization).  3. Right ventricular systolic function is normal. The right ventricular size is normal.  4. The mitral valve is normal in structure. No evidence of mitral valve regurgitation. No evidence of mitral stenosis.  5. The aortic valve is tricuspid. Aortic valve regurgitation is not visualized. No aortic stenosis is present.  6. The inferior vena cava is normal in size with greater than 50% respiratory variability, suggesting right atrial pressure of 3 mmHg.   PUD Prophylaxis :   DVT Prophylaxis  :  Lovenox    Lab Results  Component Value Date   PLT 286 01/01/2020    Inpatient Medications  Scheduled Meds: . albuterol  2 puff Inhalation Q6H  . vitamin C  500 mg Oral Daily  . aspirin  81 mg Oral  Daily  . diltiazem  90 mg Oral Q8H  . enoxaparin (LOVENOX) injection  60 mg Subcutaneous Q24H  . famotidine  20 mg Oral BID  . insulin aspart  0-9 Units Subcutaneous Q6H  . isosorbide mononitrate  20 mg Oral Daily  . methylPREDNISolone (SOLU-MEDROL) injection  60 mg Intravenous Q12H  . sodium chloride flush  3 mL Intravenous Q12H  . tamsulosin  0.4 mg Oral PC supper  . zinc sulfate  220 mg Oral Daily   Continuous Infusions: . remdesivir 100 mg in NS 100 mL 100 mg (01/01/20 0933)   PRN  Meds:.acetaminophen, chlorpheniramine-HYDROcodone, guaiFENesin-dextromethorphan, [DISCONTINUED] ondansetron **OR** ondansetron (ZOFRAN) IV  Antibiotics  :    Anti-infectives (From admission, onward)   Start     Dose/Rate Route Frequency Ordered Stop   12/31/19 1000  remdesivir 100 mg in sodium chloride 0.9 % 100 mL IVPB     100 mg 200 mL/hr over 30 Minutes Intravenous Daily 12/30/19 1529 01/04/20 0959   12/30/19 1630  remdesivir 200 mg in sodium chloride 0.9% 250 mL IVPB     200 mg 580 mL/hr over 30 Minutes Intravenous Once 12/30/19 1529 12/30/19 1717       Time Spent in minutes  30   Lala Lund M.D on 01/01/2020 at 10:31 AM  To page go to [email or web address] - password [email or web address]  [facility] -  Office  [phone]     See all Orders from today for further details    Objective:   Vitals:   01/01/20 0815 01/01/20 0828 01/01/20 0900 01/01/20 0957  BP:  140/80    Pulse:  85 93   Resp:  18 18   Temp:      TempSrc:      SpO2: (!) 86% (!) 87% 90% 90%  Weight:      Height:        Wt Readings from Last 3 Encounters:  12/31/19 125.9 kg  11/23/16 126.9 kg  07/25/16 127.9 kg     Intake/Output Summary (Last 24 hours) at 01/01/2020 1031 Last data filed at 01/01/2020 0934 Gross per 24 hour  Intake 923.87 ml  Output 1700 ml  Net -776.13 ml     Physical Exam  Awake Alert, No new F.N deficits, Normal affect Todd.AT,PERRAL Supple Neck,No JVD, No cervical lymphadenopathy appriciated.  Symmetrical Chest wall movement, Good air movement bilaterally, few rales RRR,No Gallops, Rubs or new Murmurs, No Parasternal Heave +ve B.Sounds, Abd Soft, No tenderness, No organomegaly appriciated, No rebound - guarding or rigidity. No Cyanosis, Clubbing or edema, No new Rash or bruise     Data Review:    CBC Recent Labs  Lab 12/28/19 1643 12/30/19 1040 12/31/19 0357 01/01/20 0525  WBC 12.6* 13.7* 11.4* 11.0*  HGB 15.0 14.3 14.6 14.8  HCT 44.5 43.6 44.5 45.0  PLT 272 297  278 286  MCV 91.8 92.8 93.3 92.0  MCH 30.9 30.4 30.6 30.3  MCHC 33.7 32.8 32.8 32.9  RDW 14.0 13.7 14.0 13.7  LYMPHSABS 1.2 2.0 0.8 1.0  MONOABS 1.3* 1.0 0.6 0.8  EOSABS 0.0 0.0 0.0 0.0  BASOSABS 0.1 0.0 0.0 0.0    Chemistries  Recent Labs  Lab 12/28/19 1643 12/30/19 1040 12/30/19 1645 12/31/19 0357 01/01/20 0525  NA 135 139  --  138 138  K 3.4* 3.5  --  3.7 4.2  CL 101 102  --  100 104  CO2 24 26  --  25 25  GLUCOSE 139* 108*  --  141* 152*  BUN 10 9  --  9 8  CREATININE 1.09 1.23  --  0.89 0.86  CALCIUM 8.4* 7.9*  --  8.3* 8.6*  AST  --  47*  --  31 46*  ALT  --  50*  --  47* 61*  ALKPHOS  --  50  --  55 56  BILITOT  --  1.2  --  0.9 0.7  MG  --  1.8  --  2.1 2.2  TSH  --   --  1.417  --   --      ------------------------------------------------------------------------------------------------------------------ No results for input(s): CHOL, HDL, LDLCALC, TRIG, CHOLHDL, LDLDIRECT in the last 72 hours.  No results found for: HGBA1C ------------------------------------------------------------------------------------------------------------------ Recent Labs    12/30/19 1645  TSH 1.417    Cardiac Enzymes No results for input(s): CKMB, TROPONINI, MYOGLOBIN in the last 168 hours.  Invalid input(s): CK ------------------------------------------------------------------------------------------------------------------    Component Value Date/Time   BNP 36.9 01/01/2020 0525    Micro Results Recent Results (from the past 240 hour(s))  SARS Coronavirus 2 Ag (30 min TAT) - Nasal Swab (BD Veritor Kit)     Status: Abnormal   Collection Time: 12/28/19  3:33 PM   Specimen: Nasal Swab (BD Veritor Kit)  Result Value Ref Range Status   SARS Coronavirus 2 Ag POSITIVE (A) NEGATIVE Final    Comment: RESULT CALLED TO, READ BACK BY AND VERIFIED WITH: PEGRAM JERI RN [phone] [id number] PHILLIPS C (NOTE) SARS-CoV-2 antigen PRESENT. Positive results indicate the presence of viral  antigens, but clinical correlation with patient history and other diagnostic information is necessary to determine patient infection status.  Positive results do not rule out bacterial infection or co-infection  with other viruses. False positive results are rare but can occur, and confirmatory RT-PCR testing may be appropriate in some circumstances. The expected result is Negative. Fact Sheet for Patients: PodPark.tn Fact Sheet for Providers: GiftContent.is  This test is not yet approved or cleared by the [place] FDA and  has been authorized for detection and/or diagnosis of SARS-CoV-2 by FDA under an Emergency Use Authorization (EUA).  This EUA will remain in effect (meaning this test can be used) for the duration of  the COVID-19 decla ration under Section 564(b)(1) of the Act, 21 U.S.C. section 360bbb-3(b)(1), unless the authorization is terminated or revoked sooner. Performed at [facility], [address], [place], [place] [address]     Radiology Reports DG Chest [place] 1 View  Result Date: 01/01/2020 CLINICAL DATA:  Shortness of breath, COVID positive EXAM: PORTABLE CHEST 1 VIEW COMPARISON:  12/30/2019 FINDINGS: Low lung volumes. Cardiomegaly. Patchy bilateral airspace disease worsening since prior study. No effusions or pneumothorax. No acute bony abnormality. IMPRESSION: Low lung volumes with worsening patchy bilateral airspace disease/pneumonia. Electronically Signed   By: Rolm Baptise M.D.   On: 01/01/2020 08:56   DG Chest Port 1 View  Result Date: 12/30/2019 CLINICAL DATA:  Shortness of breath, recent COVID positive EXAM: PORTABLE CHEST 1 VIEW COMPARISON:  12/28/2019 FINDINGS: Low lung volumes. Persistent bilateral opacities, greatest at the left lung base. No significant pleural effusion. No pneumothorax. Stable cardiomediastinal contours. IMPRESSION: Similar appearance of bilateral opacities likely  reflecting pneumonia. Electronically Signed   By: Macy Mis M.D.   On: 12/30/2019 11:11   DG Chest Portable 1 View  Result Date: 12/28/2019 CLINICAL DATA:  Congestion, cough, fever EXAM: PORTABLE CHEST 1 VIEW COMPARISON:  01/31/2018 FINDINGS: Single frontal view of the chest demonstrates a  stable cardiac silhouette. Interval development of multifocal bilateral airspace disease greatest in the right upper and left lower lung zones. No effusion or pneumothorax. No acute bony abnormality. IMPRESSION: 1. Multifocal bilateral pneumonia. Electronically Signed   By: Randa Ngo M.D.   On: 12/28/2019 16:19   ECHOCARDIOGRAM LIMITED  Result Date: 12/31/2019    ECHOCARDIOGRAM REPORT   Patient Name:   Jared Lopez Date of Exam: 12/31/2019 Medical Rec #:  [id number]        Height:       71.0 in Accession #:    [id number]       Weight:       277.5 lb Date of Birth:  May 07, 1966        BSA:          2.423 m Patient Age:    72 years         BP:           119/67 mmHg Patient Gender: M                HR:           79 bpm. Exam Location:  Inpatient Procedure: 2D Echo, Intracardiac Opacification Agent, Cardiac Doppler and Color            Doppler Indications:    Atrial fibrillation 427.31/I48.91  History:        Patient has no prior history of Echocardiogram examinations.                 Risk Factors:Hypertension, Dyslipidemia and Sleep Apnea.                 Elevated troponin.  Sonographer:    Clayton Lefort RDCS (AE) Referring Phys: [id number] [facility] A SMITH  Sonographer Comments: Patient is morbidly obese and suboptimal apical window. Image acquisition challenging due to patient body habitus. IMPRESSIONS  1. Definity used; mild to moderate global reduction in LV systolic function; grade 2 diastolic dysfunction; mild LVE; mild LVH.  2. Left ventricular ejection fraction, by estimation, is 40 to 45%. The left ventricle has mildly decreased function. The left ventricle demonstrates global hypokinesis. The left ventricular  internal cavity size was mildly dilated. There is mild left ventricular hypertrophy. Left ventricular diastolic parameters are consistent with Grade II diastolic dysfunction (pseudonormalization).  3. Right ventricular systolic function is normal. The right ventricular size is normal.  4. The mitral valve is normal in structure. No evidence of mitral valve regurgitation. No evidence of mitral stenosis.  5. The aortic valve is tricuspid. Aortic valve regurgitation is not visualized. No aortic stenosis is present.  6. The inferior vena cava is normal in size with greater than 50% respiratory variability, suggesting right atrial pressure of 3 mmHg. FINDINGS  Left Ventricle: Left ventricular ejection fraction, by estimation, is 40 to 45%. The left ventricle has mildly decreased function. The left ventricle demonstrates global hypokinesis. Definity contrast agent was given IV to delineate the left ventricular  endocardial borders. The left ventricular internal cavity size was mildly dilated. There is mild left ventricular hypertrophy. Left ventricular diastolic parameters are consistent with Grade II diastolic dysfunction (pseudonormalization). Right Ventricle: The right ventricular size is normal.Right ventricular systolic function is normal. Left Atrium: Left atrial size was normal in size. Right Atrium: Right atrial size was normal in size. Pericardium: There is no evidence of pericardial effusion. Mitral Valve: The mitral valve is normal in structure. Normal mobility of the mitral valve leaflets. No evidence of mitral valve  regurgitation. No evidence of mitral valve stenosis. Tricuspid Valve: The tricuspid valve is normal in structure. Tricuspid valve regurgitation is trivial. No evidence of tricuspid stenosis. Aortic Valve: The aortic valve is tricuspid. Aortic valve regurgitation is not visualized. No aortic stenosis is present. Pulmonic Valve: The pulmonic valve was not well visualized. Pulmonic valve  regurgitation is not visualized. No evidence of pulmonic stenosis. Aorta: The aortic root is normal in size and structure. Venous: The inferior vena cava is normal in size with greater than 50% respiratory variability, suggesting right atrial pressure of 3 mmHg. IAS/Shunts: No atrial level shunt detected by color flow Doppler. Additional Comments: Definity used; mild to moderate global reduction in LV systolic function; grade 2 diastolic dysfunction; mild LVE; mild LVH.  LEFT VENTRICLE PLAX 2D LVIDd:         5.66 cm      Diastology LVIDs:         4.68 cm      LV e' lateral:   9.03 cm/s LV PW:         1.20 cm      LV E/e' lateral: 9.6 LV IVS:        1.17 cm      LV e' medial:    7.83 cm/s LVOT diam:     2.20 cm      LV E/e' medial:  11.1 LV SV:         54 LV SV Index:   22 LVOT Area:     3.80 cm  LV Volumes (MOD) LV vol d, MOD A4C: 139.0 ml LV vol s, MOD A4C: 75.2 ml LV SV MOD A4C:     139.0 ml IVC IVC diam: 1.78 cm LEFT ATRIUM           Index LA Vol (A2C): 36.7 ml 15.14 ml/m LA Vol (A4C): 58.9 ml 24.30 ml/m  AORTIC VALVE LVOT Vmax:   78.30 cm/s LVOT Vmean:  51.900 cm/s LVOT VTI:    0.142 m  AORTA Ao Root diam: 3.60 cm Ao Asc diam:  3.30 cm MITRAL VALVE MV Area (PHT): 4.29 cm    SHUNTS MV Decel Time: 177 msec    Systemic VTI:  0.14 m MV E velocity: 87.00 cm/s  Systemic Diam: 2.20 cm MV A velocity: 62.10 cm/s MV E/A ratio:  1.40 Kirk Ruths MD Electronically signed by Kirk Ruths MD Signature Date/Time: 12/31/2019/2:30:12 PM    Final

## 2020-01-01 NOTE — Progress Notes (Signed)
Transitions of Care Pharmacist Note  Jared Lopez is a 54 y.o. male that has been diagnosed with A Fib and will be prescribed Eliquis (apixaban) at discharge.   Patient Education: I provided the following education on 01/01/2020 to the patient: How to take the medication Described what the medication is Signs of bleeding Signs/symptoms of VTE and stroke  Answered their questions  Discharge Medications Plan: The patient wants to have their discharge medications filled by the Transitions of Care pharmacy rather than their usual pharmacy.  The discharge orders pharmacy has been changed to the Transitions of Care pharmacy, the patient will receive a phone call regarding co-pay, and their medications will be delivered by the Transitions of Care pharmacy.    Thank you,   Charlett Nose, PharmD  PGY1 Acute Care Pharmacy Resident January 01, 2020

## 2020-01-01 NOTE — Progress Notes (Signed)
ANTICOAGULATION CONSULT NOTE - Initial Consult  Pharmacy Consult for Apixaban Indication: atrial fibrillation  No Known Allergies  Patient Measurements: Height: 5' 10.98" (180.3 cm) Weight: 125.9 kg (277 lb 8 oz) IBW/kg (Calculated) : 75.26  Vital Signs: Temp: 97.7 F (36.5 C) (04/28 0741) Temp Source: Oral (04/28 0741) BP: 140/80 (04/28 0828) Pulse Rate: 93 (04/28 0900)  Labs: Recent Labs    12/30/19 1040 12/30/19 1040 12/31/19 0357 01/01/20 0525  HGB 14.3   < > 14.6 14.8  HCT 43.6  --  44.5 45.0  PLT 297  --  278 286  CREATININE 1.23  --  0.89 0.86  TROPONINIHS 24*  --   --   --    < > = values in this interval not displayed.    Estimated Creatinine Clearance: 134.2 mL/min (by C-G formula based on SCr of 0.86 mg/dL).   Medical History: Past Medical History:  Diagnosis Date  . Abdominal pain   . Arthritis   . Blood in stool   . Blurred vision   . Chronic back pain   . Constipation   . Difficulty urinating   . Enlarged prostate   . Generalized headaches    high blood pressure onset  . Hernia   . Hyperlipidemia   . Hypertension   . Mass of perirectal soft tissue   . Over weight   . Rectal bleeding   . Sleep apnea    uses CPAP nightly   Assessment:  54 yr old male admitted with hx COVID positive 4/24 admitted 4/26 for treatment.   New onset atrial fibrillation, to begin Apixaban.  Has been on Enoxaparin for VTE prophylaxis, last dose 60 mg (~0.5 mg/kg) last night at 8pm. CBC normal. Good renal function.   Goal of Therapy:  appropriate Apixaban dose for indication Monitor platelets by anticoagulation protocol: Yes   Plan:   Discontinue Enoxaparin.  Begin Apixaban 5 mg PO BID.  Monitor for any bleeding.  Dennie Fetters, RPh Phone: [phone] 01/01/2020,11:30 AM

## 2020-01-02 LAB — COMPREHENSIVE METABOLIC PANEL
ALT: 66 U/L — ABNORMAL HIGH (ref 0–44)
AST: 44 U/L — ABNORMAL HIGH (ref 15–41)
Albumin: 2.7 g/dL — ABNORMAL LOW (ref 3.5–5.0)
Alkaline Phosphatase: 55 U/L (ref 38–126)
Anion gap: 12 (ref 5–15)
BUN: 17 mg/dL (ref 6–20)
CO2: 26 mmol/L (ref 22–32)
Calcium: 8.7 mg/dL — ABNORMAL LOW (ref 8.9–10.3)
Chloride: 100 mmol/L (ref 98–111)
Creatinine, Ser: 0.87 mg/dL (ref 0.61–1.24)
GFR calc Af Amer: >60 mL/min (ref >60)
GFR calc non Af Amer: >60 mL/min (ref >60)
Glucose, Bld: 155 mg/dL — ABNORMAL HIGH (ref 70–99)
Potassium: 3.8 mmol/L (ref 3.5–5.1)
Sodium: 138 mmol/L (ref 135–145)
Total Bilirubin: 0.8 mg/dL (ref 0.3–1.2)
Total Protein: 7 g/dL (ref 6.5–8.1)

## 2020-01-02 LAB — CBC WITH DIFFERENTIAL/PLATELET
Abs Immature Granulocytes: 0.09 10*3/uL — ABNORMAL HIGH (ref 0.00–0.07)
Basophils Absolute: 0 10*3/uL (ref 0.0–0.1)
Basophils Relative: 0 %
Eosinophils Absolute: 0 10*3/uL (ref 0.0–0.5)
Eosinophils Relative: 0 %
HCT: 45.8 % (ref 39.0–52.0)
Hemoglobin: 15.1 g/dL (ref 13.0–17.0)
Immature Granulocytes: 1 %
Lymphocytes Relative: 11 %
Lymphs Abs: 1.2 10*3/uL (ref 0.7–4.0)
MCH: 30.3 pg (ref 26.0–34.0)
MCHC: 33 g/dL (ref 30.0–36.0)
MCV: 92 fL (ref 80.0–100.0)
Monocytes Absolute: 1 10*3/uL (ref 0.1–1.0)
Monocytes Relative: 9 %
Neutro Abs: 9.1 10*3/uL — ABNORMAL HIGH (ref 1.7–7.7)
Neutrophils Relative %: 79 %
Platelets: 358 10*3/uL (ref 150–400)
RBC: 4.98 MIL/uL (ref 4.22–5.81)
RDW: 13.8 % (ref 11.5–15.5)
WBC: 11.4 10*3/uL — ABNORMAL HIGH (ref 4.0–10.5)
nRBC: 0 % (ref 0.0–0.2)

## 2020-01-02 LAB — GLUCOSE, CAPILLARY
Glucose-Capillary: 161 mg/dL — ABNORMAL HIGH (ref 70–99)
Glucose-Capillary: 150 mg/dL — ABNORMAL HIGH (ref 70–99)
Glucose-Capillary: 181 mg/dL — ABNORMAL HIGH (ref 70–99)
Glucose-Capillary: 182 mg/dL — ABNORMAL HIGH (ref 70–99)

## 2020-01-02 LAB — BRAIN NATRIURETIC PEPTIDE: B Natriuretic Peptide: 26.3 pg/mL (ref 0.0–100.0)

## 2020-01-02 LAB — C-REACTIVE PROTEIN: CRP: 5.1 mg/dL — ABNORMAL HIGH (ref <1.0)

## 2020-01-02 LAB — D-DIMER, QUANTITATIVE: D-Dimer, Quant: 0.78 ug/mL-FEU — ABNORMAL HIGH (ref 0.00–0.50)

## 2020-01-02 LAB — MAGNESIUM: Magnesium: 2.3 mg/dL (ref 1.7–2.4)

## 2020-01-02 LAB — PROCALCITONIN: Procalcitonin: <0.1 ng/mL

## 2020-01-02 MED ORDER — FUROSEMIDE 10 MG/ML IJ SOLN
60.0000 mg | Freq: Once | INTRAMUSCULAR | Status: AC
  Administered 2020-01-02: 60 mg via INTRAVENOUS
  Filled 2020-01-02: qty 6

## 2020-01-02 MED ORDER — POTASSIUM CHLORIDE CRYS ER 20 MEQ PO TBCR
40.0000 meq | EXTENDED_RELEASE_TABLET | Freq: Once | ORAL | Status: AC
  Administered 2020-01-02: 40 meq via ORAL
  Filled 2020-01-02: qty 2

## 2020-01-02 NOTE — Progress Notes (Signed)
PROGRESS NOTE                                                                                                                                                                                                             Patient Demographics:    Jared Lopez, is a 54 y.o. male, DOB - 06-25-1966, ZOX:[id number]  Outpatient Primary MD for the patient is Mirna Mires, MD    LOS - 3  Admit date - 12/30/2019    Chief Complaint  Patient presents with  . Atrial Fibrillation  . Covid Exposure       Brief Narrative  Jared Lopez is a 54 y.o. male with medical history significant of hypertension, hyperlipidemia, obesity, BPH, and OSA on CPAP presents with complaints of shortness of breath.  He had not been feeling well over the last 1-2 weeks with reports of a dry cough, in the ER he was diagnosed with COVID-19 pneumonia along with A. fib RVR and admitted to the hospital.   Subjective:   Patient in bed, appears comfortable, denies any headache, no fever, no chest pain or pressure, no shortness of breath , no abdominal pain. No focal weakness.   Assessment  & Plan :     1.  Acute Covid 19 Viral Pneumonitis during the ongoing 2020 Covid 19 Pandemic - he has severe disease and received IV steroids, remdesivir along with Actemra appropriately, he was up to 10 L high flow oxygen and now down to 8, overall continues to feel better, will advance activity and titrate down oxygen flow the best we can, continue to monitor closely.    Encouraged the patient to sit up in chair in the daytime use I-S and flutter valve for pulmonary toiletry and then prone in bed when at night.       SpO2: 96 % O2 Flow Rate (L/min): 8 L/min  Recent Labs  Lab 12/30/19 1040 12/30/19 1211 12/30/19 1645 12/31/19 0357 01/01/20 0525 01/02/20 0409  CRP  --   --   --  12.9* 8.0* 5.1*  DDIMER  --  0.66* 0.60* 0.95* 0.74* 0.78*  BNP 48.3  --   --  56.1 36.9  26.3  PROCALCITON  --   --  <0.10 <0.10 <0.10 <0.10    Hepatic Function Latest Ref Rng & Units 01/02/2020 01/01/2020 12/31/2019  Total  Protein 6.5 - 8.1 g/dL 7.0 6.8 6.7  Albumin 3.5 - 5.0 g/dL 2.7(L) 2.7(L) 2.8(L)  AST 15 - 41 U/L 44(H) 46(H) 31  ALT 0 - 44 U/L 66(H) 61(H) 47(H)  Alk Phosphatase 38 - 126 U/L 55 56 55  Total Bilirubin 0.3 - 1.2 mg/dL 0.8 0.7 0.9   Lab Results  Component Value Date   TSH 1.417 12/30/2019    2.  Paroxysmal A. fib versus new onset A. fib RVR.  [place] vas 2 score of  2 -   initially on Cardizem drip now on oral Lopressor and Eliquis combination, case discussed with his primary cardiologist Dr. Jacinto Halim who will follow the patient in the office post discharge.   3.  Mild COVID-19 viral infection related transaminitis.  Asymptomatic with stable trend.  Monitor.    4. Obesity.  BMI 38.  Follow with PCP.  5.  BPH.  On Flomax.  6.  OSA.  CPAP at night.  7.  Mild acute on chronic systolic and diastolic CHF EF around 40%.  Continue beta-blocker along with IV Lasix for diuresis.  Outpatient follow-up with his primary cardiologist Dr. Jacinto Halim post discharge.    Condition - Fair  Family Communication  : Lane Hacker [phone] on 12/31/2019, 01/01/20  Code Status :  Full  Diet :   Diet Order            Diet Heart Room service appropriate? Yes; Fluid consistency: Thin  Diet effective now               Disposition Plan  :  Stay inpatient for treatment of COVID-19 pneumonia with IV REMDESIVIR  Consults  :  Cards  Procedures  :    TTE -   1. Definity used; mild to moderate global reduction in LV systolic function; grade 2 diastolic dysfunction; mild LVE; mild LVH.  2. Left ventricular ejection fraction, by estimation, is 40 to 45%. The left ventricle has mildly decreased function. The left ventricle demonstrates global hypokinesis. The left ventricular internal cavity size was mildly dilated. There is mild left ventricular hypertrophy. Left  ventricular diastolic parameters are consistent with Grade II diastolic dysfunction (pseudonormalization).  3. Right ventricular systolic function is normal. The right ventricular size is normal.  4. The mitral valve is normal in structure. No evidence of mitral valve regurgitation. No evidence of mitral stenosis.  5. The aortic valve is tricuspid. Aortic valve regurgitation is not visualized. No aortic stenosis is present.  6. The inferior vena cava is normal in size with greater than 50% respiratory variability, suggesting right atrial pressure of 3 mmHg.   PUD Prophylaxis :   DVT Prophylaxis  :  Lovenox    Lab Results  Component Value Date   PLT 358 01/02/2020    Inpatient Medications  Scheduled Meds: . albuterol  2 puff Inhalation Q6H  . apixaban  5 mg Oral BID  . vitamin C  500 mg Oral Daily  . famotidine  20 mg Oral BID  . insulin aspart  0-9 Units Subcutaneous Q6H  . isosorbide mononitrate  20 mg Oral Daily  . methylPREDNISolone (SOLU-MEDROL) injection  60 mg Intravenous Q12H  . metoprolol tartrate  50 mg Oral BID  . sodium chloride flush  3 mL Intravenous Q12H  . tamsulosin  0.4 mg Oral PC supper  . zinc sulfate  220 mg Oral Daily   Continuous Infusions: . remdesivir 100 mg in NS 100 mL 100 mg (01/02/20 0925)   PRN Meds:.acetaminophen,  chlorpheniramine-HYDROcodone, guaiFENesin-dextromethorphan, [DISCONTINUED] ondansetron **OR** ondansetron (ZOFRAN) IV  Antibiotics  :    Anti-infectives (From admission, onward)   Start     Dose/Rate Route Frequency Ordered Stop   12/31/19 1000  remdesivir 100 mg in sodium chloride 0.9 % 100 mL IVPB     100 mg 200 mL/hr over 30 Minutes Intravenous Daily 12/30/19 1529 01/04/20 0959   12/30/19 1630  remdesivir 200 mg in sodium chloride 0.9% 250 mL IVPB     200 mg 580 mL/hr over 30 Minutes Intravenous Once 12/30/19 1529 12/30/19 1717       Time Spent in minutes  30   Susa Raring M.D on 01/02/2020 at 9:38 AM  To page go to  [email or web address] - password [facility]  [facility] -  Office  [phone]     See all Orders from today for further details    Objective:   Vitals:   01/02/20 0131 01/02/20 0142 01/02/20 0456 01/02/20 0530  BP: 131/81  133/87   Pulse: 71 81 63 66  Resp: (!) 33 (!) 25 (!) 28 (!) 23  Temp: 98 F (36.7 C)  97.9 F (36.6 C)   TempSrc: Oral  Oral   SpO2: (!) 89% (!) 88% 93% 96%  Weight:      Height:        Wt Readings from Last 3 Encounters:  12/31/19 125.9 kg  11/23/16 126.9 kg  07/25/16 127.9 kg     Intake/Output Summary (Last 24 hours) at 01/02/2020 0938 Last data filed at 01/02/2020 0908 Gross per 24 hour  Intake 323 ml  Output 1075 ml  Net -752 ml     Physical Exam  Awake Alert, No new F.N deficits, Normal affect [place].AT,PERRAL Supple Neck,No JVD, No cervical lymphadenopathy appriciated.  Symmetrical Chest wall movement, Good air movement bilaterally, few rales RRR,No Gallops, Rubs or new Murmurs, No Parasternal Heave +ve B.Sounds, Abd Soft, No tenderness, No organomegaly appriciated, No rebound - guarding or rigidity. No Cyanosis, trace edema, No new Rash or bruise    Data Review:    CBC Recent Labs  Lab 12/28/19 1643 12/30/19 1040 12/31/19 0357 01/01/20 0525 01/02/20 0409  WBC 12.6* 13.7* 11.4* 11.0* 11.4*  HGB 15.0 14.3 14.6 14.8 15.1  HCT 44.5 43.6 44.5 45.0 45.8  PLT 272 297 278 286 358  MCV 91.8 92.8 93.3 92.0 92.0  MCH 30.9 30.4 30.6 30.3 30.3  MCHC 33.7 32.8 32.8 32.9 33.0  RDW 14.0 13.7 14.0 13.7 13.8  LYMPHSABS 1.2 2.0 0.8 1.0 1.2  MONOABS 1.3* 1.0 0.6 0.8 1.0  EOSABS 0.0 0.0 0.0 0.0 0.0  BASOSABS 0.1 0.0 0.0 0.0 0.0    Chemistries  Recent Labs  Lab 12/28/19 1643 12/30/19 1040 12/30/19 1645 12/31/19 0357 01/01/20 0525 01/02/20 0409  NA 135 139  --  138 138 138  K 3.4* 3.5  --  3.7 4.2 3.8  CL 101 102  --  100 104 100  CO2 24 26  --  25 25 26   GLUCOSE 139* 108*  --  141* 152* 155*  BUN 10 9  --  9 8 17   CREATININE 1.09  1.23  --  0.89 0.86 0.87  CALCIUM 8.4* 7.9*  --  8.3* 8.6* 8.7*  AST  --  47*  --  31 46* 44*  ALT  --  50*  --  47* 61* 66*  ALKPHOS  --  50  --  55 56 55  BILITOT  --  1.2  --  0.9 0.7 0.8  MG  --  1.8  --  2.1 2.2 2.3  TSH  --   --  1.417  --   --   --      ------------------------------------------------------------------------------------------------------------------ No results for input(s): CHOL, HDL, LDLCALC, TRIG, CHOLHDL, LDLDIRECT in the last 72 hours.  No results found for: HGBA1C ------------------------------------------------------------------------------------------------------------------ Recent Labs    12/30/19 1645  TSH 1.417    Cardiac Enzymes No results for input(s): CKMB, TROPONINI, MYOGLOBIN in the last 168 hours.  Invalid input(s): CK ------------------------------------------------------------------------------------------------------------------    Component Value Date/Time   BNP 26.3 01/02/2020 0409    Micro Results Recent Results (from the past 240 hour(s))  SARS Coronavirus 2 Ag (30 min TAT) - Nasal Swab (BD Veritor Kit)     Status: Abnormal   Collection Time: 12/28/19  3:33 PM   Specimen: Nasal Swab (BD Veritor Kit)  Result Value Ref Range Status   SARS Coronavirus 2 Ag POSITIVE (A) NEGATIVE Final    Comment: RESULT CALLED TO, READ BACK BY AND VERIFIED WITH: PEGRAM JERI RN [phone] [id number] PHILLIPS C (NOTE) SARS-CoV-2 antigen PRESENT. Positive results indicate the presence of viral antigens, but clinical correlation with patient history and other diagnostic information is necessary to determine patient infection status.  Positive results do not rule out bacterial infection or co-infection  with other viruses. False positive results are rare but can occur, and confirmatory RT-PCR testing may be appropriate in some circumstances. The expected result is Negative. Fact Sheet for Patients: https://sanders-williams.net/ Fact Sheet for  Providers: https://martinez.com/  This test is not yet approved or cleared by the [place] FDA and  has been authorized for detection and/or diagnosis of SARS-CoV-2 by FDA under an Emergency Use Authorization (EUA).  This EUA will remain in effect (meaning this test can be used) for the duration of  the COVID-19 decla ration under Section 564(b)(1) of the Act, 21 U.S.C. section 360bbb-3(b)(1), unless the authorization is terminated or revoked sooner. Performed at [facility], [address], [place], [place] [address]     Radiology Reports DG Chest [place] 1 View  Result Date: 01/01/2020 CLINICAL DATA:  Shortness of breath, COVID positive EXAM: PORTABLE CHEST 1 VIEW COMPARISON:  12/30/2019 FINDINGS: Low lung volumes. Cardiomegaly. Patchy bilateral airspace disease worsening since prior study. No effusions or pneumothorax. No acute bony abnormality. IMPRESSION: Low lung volumes with worsening patchy bilateral airspace disease/pneumonia. Electronically Signed   By: Charlett Nose M.D.   On: 01/01/2020 08:56   DG Chest Port 1 View  Result Date: 12/30/2019 CLINICAL DATA:  Shortness of breath, recent COVID positive EXAM: PORTABLE CHEST 1 VIEW COMPARISON:  12/28/2019 FINDINGS: Low lung volumes. Persistent bilateral opacities, greatest at the left lung base. No significant pleural effusion. No pneumothorax. Stable cardiomediastinal contours. IMPRESSION: Similar appearance of bilateral opacities likely reflecting pneumonia. Electronically Signed   By: Guadlupe Spanish M.D.   On: 12/30/2019 11:11   DG Chest Portable 1 View  Result Date: 12/28/2019 CLINICAL DATA:  Congestion, cough, fever EXAM: PORTABLE CHEST 1 VIEW COMPARISON:  01/31/2018 FINDINGS: Single frontal view of the chest demonstrates a stable cardiac silhouette. Interval development of multifocal bilateral airspace disease greatest in the right upper and left lower lung zones. No effusion or pneumothorax. No  acute bony abnormality. IMPRESSION: 1. Multifocal bilateral pneumonia. Electronically Signed   By: Sharlet Salina M.D.   On: 12/28/2019 16:19   ECHOCARDIOGRAM LIMITED  Result Date: 12/31/2019    ECHOCARDIOGRAM REPORT   Patient  Name:   [name] Date of Exam: 12/31/2019 Medical Rec #:  [id number]        Height:       71.0 in Accession #:    [id number]       Weight:       277.5 lb Date of Birth:  18-Aug-1966        BSA:          2.423 m Patient Age:    53 years         BP:           119/67 mmHg Patient Gender: M                HR:           79 bpm. Exam Location:  Inpatient Procedure: 2D Echo, Intracardiac Opacification Agent, Cardiac Doppler and Color            Doppler Indications:    Atrial fibrillation 427.31/I48.91  History:        Patient has no prior history of Echocardiogram examinations.                 Risk Factors:Hypertension, Dyslipidemia and Sleep Apnea.                 Elevated troponin.  Sonographer:    Ross Ludwig RDCS (AE) Referring Phys: [id number] [facility] A SMITH  Sonographer Comments: Patient is morbidly obese and suboptimal apical window. Image acquisition challenging due to patient body habitus. IMPRESSIONS  1. Definity used; mild to moderate global reduction in LV systolic function; grade 2 diastolic dysfunction; mild LVE; mild LVH.  2. Left ventricular ejection fraction, by estimation, is 40 to 45%. The left ventricle has mildly decreased function. The left ventricle demonstrates global hypokinesis. The left ventricular internal cavity size was mildly dilated. There is mild left ventricular hypertrophy. Left ventricular diastolic parameters are consistent with Grade II diastolic dysfunction (pseudonormalization).  3. Right ventricular systolic function is normal. The right ventricular size is normal.  4. The mitral valve is normal in structure. No evidence of mitral valve regurgitation. No evidence of mitral stenosis.  5. The aortic valve is tricuspid. Aortic valve regurgitation is not  visualized. No aortic stenosis is present.  6. The inferior vena cava is normal in size with greater than 50% respiratory variability, suggesting right atrial pressure of 3 mmHg. FINDINGS  Left Ventricle: Left ventricular ejection fraction, by estimation, is 40 to 45%. The left ventricle has mildly decreased function. The left ventricle demonstrates global hypokinesis. Definity contrast agent was given IV to delineate the left ventricular  endocardial borders. The left ventricular internal cavity size was mildly dilated. There is mild left ventricular hypertrophy. Left ventricular diastolic parameters are consistent with Grade II diastolic dysfunction (pseudonormalization). Right Ventricle: The right ventricular size is normal.Right ventricular systolic function is normal. Left Atrium: Left atrial size was normal in size. Right Atrium: Right atrial size was normal in size. Pericardium: There is no evidence of pericardial effusion. Mitral Valve: The mitral valve is normal in structure. Normal mobility of the mitral valve leaflets. No evidence of mitral valve regurgitation. No evidence of mitral valve stenosis. Tricuspid Valve: The tricuspid valve is normal in structure. Tricuspid valve regurgitation is trivial. No evidence of tricuspid stenosis. Aortic Valve: The aortic valve is tricuspid. Aortic valve regurgitation is not visualized. No aortic stenosis is present. Pulmonic Valve: The pulmonic valve was not well visualized. Pulmonic valve regurgitation is not visualized. No evidence of pulmonic  stenosis. Aorta: The aortic root is normal in size and structure. Venous: The inferior vena cava is normal in size with greater than 50% respiratory variability, suggesting right atrial pressure of 3 mmHg. IAS/Shunts: No atrial level shunt detected by color flow Doppler. Additional Comments: Definity used; mild to moderate global reduction in LV systolic function; grade 2 diastolic dysfunction; mild LVE; mild LVH.  LEFT  VENTRICLE PLAX 2D LVIDd:         5.66 cm      Diastology LVIDs:         4.68 cm      LV e' lateral:   9.03 cm/s LV PW:         1.20 cm      LV E/e' lateral: 9.6 LV IVS:        1.17 cm      LV e' medial:    7.83 cm/s LVOT diam:     2.20 cm      LV E/e' medial:  11.1 LV SV:         54 LV SV Index:   22 LVOT Area:     3.80 cm  LV Volumes (MOD) LV vol d, MOD A4C: 139.0 ml LV vol s, MOD A4C: 75.2 ml LV SV MOD A4C:     139.0 ml IVC IVC diam: 1.78 cm LEFT ATRIUM           Index LA Vol (A2C): 36.7 ml 15.14 ml/m LA Vol (A4C): 58.9 ml 24.30 ml/m  AORTIC VALVE LVOT Vmax:   78.30 cm/s LVOT Vmean:  51.900 cm/s LVOT VTI:    0.142 m  AORTA Ao Root diam: 3.60 cm Ao Asc diam:  3.30 cm MITRAL VALVE MV Area (PHT): 4.29 cm    SHUNTS MV Decel Time: 177 msec    Systemic VTI:  0.14 m MV E velocity: 87.00 cm/s  Systemic Diam: 2.20 cm MV A velocity: 62.10 cm/s MV E/A ratio:  1.40 Olga Millers MD Electronically signed by Olga Millers MD Signature Date/Time: 12/31/2019/2:30:12 PM    Final

## 2020-01-02 NOTE — Plan of Care (Signed)
  Problem: Clinical Measurements: Goal: Respiratory complications will improve Outcome: Progressing   

## 2020-01-03 LAB — COMPREHENSIVE METABOLIC PANEL
ALT: 79 U/L — ABNORMAL HIGH (ref 0–44)
AST: 42 U/L — ABNORMAL HIGH (ref 15–41)
Albumin: 2.8 g/dL — ABNORMAL LOW (ref 3.5–5.0)
Alkaline Phosphatase: 56 U/L (ref 38–126)
Anion gap: 9 (ref 5–15)
BUN: 18 mg/dL (ref 6–20)
CO2: 29 mmol/L (ref 22–32)
Calcium: 8.7 mg/dL — ABNORMAL LOW (ref 8.9–10.3)
Chloride: 99 mmol/L (ref 98–111)
Creatinine, Ser: 0.95 mg/dL (ref 0.61–1.24)
GFR calc Af Amer: >60 mL/min (ref >60)
GFR calc non Af Amer: >60 mL/min (ref >60)
Glucose, Bld: 155 mg/dL — ABNORMAL HIGH (ref 70–99)
Potassium: 4.3 mmol/L (ref 3.5–5.1)
Sodium: 137 mmol/L (ref 135–145)
Total Bilirubin: 0.9 mg/dL (ref 0.3–1.2)
Total Protein: 6.8 g/dL (ref 6.5–8.1)

## 2020-01-03 LAB — BRAIN NATRIURETIC PEPTIDE: B Natriuretic Peptide: 33.2 pg/mL (ref 0.0–100.0)

## 2020-01-03 LAB — CBC WITH DIFFERENTIAL/PLATELET
Abs Immature Granulocytes: 0.13 10*3/uL — ABNORMAL HIGH (ref 0.00–0.07)
Basophils Absolute: 0 10*3/uL (ref 0.0–0.1)
Basophils Relative: 0 %
Eosinophils Absolute: 0 10*3/uL (ref 0.0–0.5)
Eosinophils Relative: 0 %
HCT: 46.9 % (ref 39.0–52.0)
Hemoglobin: 15.4 g/dL (ref 13.0–17.0)
Immature Granulocytes: 1 %
Lymphocytes Relative: 8 %
Lymphs Abs: 1.4 10*3/uL (ref 0.7–4.0)
MCH: 30.8 pg (ref 26.0–34.0)
MCHC: 32.8 g/dL (ref 30.0–36.0)
MCV: 93.8 fL (ref 80.0–100.0)
Monocytes Absolute: 1.4 10*3/uL — ABNORMAL HIGH (ref 0.1–1.0)
Monocytes Relative: 8 %
Neutro Abs: 13.6 10*3/uL — ABNORMAL HIGH (ref 1.7–7.7)
Neutrophils Relative %: 83 %
Platelets: 371 10*3/uL (ref 150–400)
RBC: 5 MIL/uL (ref 4.22–5.81)
RDW: 13.9 % (ref 11.5–15.5)
WBC: 16.6 10*3/uL — ABNORMAL HIGH (ref 4.0–10.5)
nRBC: 0 % (ref 0.0–0.2)

## 2020-01-03 LAB — GLUCOSE, CAPILLARY
Glucose-Capillary: 152 mg/dL — ABNORMAL HIGH (ref 70–99)
Glucose-Capillary: 156 mg/dL — ABNORMAL HIGH (ref 70–99)
Glucose-Capillary: 187 mg/dL — ABNORMAL HIGH (ref 70–99)
Glucose-Capillary: 201 mg/dL — ABNORMAL HIGH (ref 70–99)

## 2020-01-03 LAB — D-DIMER, QUANTITATIVE: D-Dimer, Quant: 0.71 ug/mL-FEU — ABNORMAL HIGH (ref 0.00–0.50)

## 2020-01-03 LAB — MAGNESIUM: Magnesium: 2.4 mg/dL (ref 1.7–2.4)

## 2020-01-03 LAB — C-REACTIVE PROTEIN: CRP: 2.3 mg/dL — ABNORMAL HIGH (ref <1.0)

## 2020-01-03 NOTE — Progress Notes (Signed)
PROGRESS NOTE                                                                                                                                                                                                             Patient Demographics:    Jared Lopez, is a 54 y.o. male, DOB - 1966/02/15, QJF:[id number]  Outpatient Primary MD for the patient is Iona Beard, MD    LOS - 4  Admit date - 12/30/2019    Chief Complaint  Patient presents with  . Atrial Fibrillation  . Covid Exposure       Brief Narrative  Jared Lopez is a 54 y.o. male with medical history significant of hypertension, hyperlipidemia, obesity, BPH, and OSA on CPAP presents with complaints of shortness of breath.  He had not been feeling well over the last 1-2 weeks with reports of a dry cough, in the ER he was diagnosed with COVID-19 pneumonia along with A. fib RVR and admitted to the hospital.   Subjective:   Patient in bed, appears comfortable, denies any headache, no fever, no chest pain or pressure, improving shortness of breath , no abdominal pain. No focal weakness.   Assessment  & Plan :     1.  Acute Covid 19 Viral Pneumonitis during the ongoing 2020 Covid 19 Pandemic - he has severe disease and received IV steroids, remdesivir along with Actemra appropriately, he is now gradually showing signs of clinical improvement was on 10 L nasal cannula oxygen and now down to 6, continue to advance activity and titrate down oxygen and monitor closely.    Encouraged the patient to sit up in chair in the daytime use I-S and flutter valve for pulmonary toiletry and then prone in bed when at night.       SpO2: 93 % O2 Flow Rate (L/min): 6 L/min  Recent Labs  Lab 12/30/19 1040 12/30/19 1211 12/30/19 1645 12/31/19 0357 01/01/20 0525 01/02/20 0409 01/03/20 0416  CRP  --   --   --  12.9* 8.0* 5.1* 2.3*  DDIMER  --    < > 0.60* 0.95* 0.74* 0.78*  0.71*  BNP 48.3  --   --  56.1 36.9 26.3 33.2  PROCALCITON  --   --  <0.10 <0.10 <0.10 <0.10  --    < > = values in  this interval not displayed.    Hepatic Function Latest Ref Rng & Units 01/03/2020 01/02/2020 01/01/2020  Total Protein 6.5 - 8.1 g/dL 6.8 7.0 6.8  Albumin 3.5 - 5.0 g/dL 2.8(L) 2.7(L) 2.7(L)  AST 15 - 41 U/L 42(H) 44(H) 46(H)  ALT 0 - 44 U/L 79(H) 66(H) 61(H)  Alk Phosphatase 38 - 126 U/L 56 55 56  Total Bilirubin 0.3 - 1.2 mg/dL 0.9 0.8 0.7   Lab Results  Component Value Date   TSH 1.417 12/30/2019    2.  Paroxysmal A. fib versus new onset A. fib RVR.  Mali vas 2 score of  2 -   initially on Cardizem drip now on oral Lopressor and Eliquis combination, case discussed with his primary cardiologist Dr. Einar Gip who will follow the patient in the office post discharge.   3.  Mild COVID-19 viral infection related transaminitis.  Asymptomatic with stable trend.  Monitor.    4. Obesity.  BMI 38.  Follow with PCP.  5.  BPH.  On Flomax.  6.  OSA.  CPAP at night.  7.  Mild acute on chronic systolic and diastolic CHF EF around 56%.  Continue beta-blocker along with IV Lasix for diuresis PRN.  Outpatient follow-up with his primary cardiologist Dr. Einar Gip post discharge.  Now appears to be compensated.    Condition - Fair  Family Communication  : Spouse Alwyn Ren [phone] on 12/31/2019, 01/01/20, 01/03/20  Code Status :  Full  Diet :   Diet Order            Diet Heart Room service appropriate? Yes; Fluid consistency: Thin  Diet effective now               Disposition Plan  :  Stay inpatient for treatment of COVID-19 pneumonia with IV REMDESIVIR  Consults  :  Cards  Procedures  :    TTE -   1. Definity used; mild to moderate global reduction in LV systolic function; grade 2 diastolic dysfunction; mild LVE; mild LVH.  2. Left ventricular ejection fraction, by estimation, is 40 to 45%. The left ventricle has mildly decreased function. The left ventricle  demonstrates global hypokinesis. The left ventricular internal cavity size was mildly dilated. There is mild left ventricular hypertrophy. Left ventricular diastolic parameters are consistent with Grade II diastolic dysfunction (pseudonormalization).  3. Right ventricular systolic function is normal. The right ventricular size is normal.  4. The mitral valve is normal in structure. No evidence of mitral valve regurgitation. No evidence of mitral stenosis.  5. The aortic valve is tricuspid. Aortic valve regurgitation is not visualized. No aortic stenosis is present.  6. The inferior vena cava is normal in size with greater than 50% respiratory variability, suggesting right atrial pressure of 3 mmHg.   PUD Prophylaxis :   DVT Prophylaxis  :  Lovenox    Lab Results  Component Value Date   PLT 371 01/03/2020    Inpatient Medications  Scheduled Meds: . albuterol  2 puff Inhalation Q6H  . apixaban  5 mg Oral BID  . vitamin C  500 mg Oral Daily  . famotidine  20 mg Oral BID  . insulin aspart  0-9 Units Subcutaneous Q6H  . isosorbide mononitrate  20 mg Oral Daily  . methylPREDNISolone (SOLU-MEDROL) injection  60 mg Intravenous Q12H  . metoprolol tartrate  50 mg Oral BID  . sodium chloride flush  3 mL Intravenous Q12H  . tamsulosin  0.4 mg Oral PC supper  .  zinc sulfate  220 mg Oral Daily   Continuous Infusions:  PRN Meds:.acetaminophen, chlorpheniramine-HYDROcodone, guaiFENesin-dextromethorphan, [DISCONTINUED] ondansetron **OR** ondansetron (ZOFRAN) IV  Antibiotics  :    Anti-infectives (From admission, onward)   Start     Dose/Rate Route Frequency Ordered Stop   12/31/19 1000  remdesivir 100 mg in sodium chloride 0.9 % 100 mL IVPB     100 mg 200 mL/hr over 30 Minutes Intravenous Daily 12/30/19 1529 01/03/20 1046   12/30/19 1630  remdesivir 200 mg in sodium chloride 0.9% 250 mL IVPB     200 mg 580 mL/hr over 30 Minutes Intravenous Once 12/30/19 1529 12/30/19 1717       Time  Spent in minutes  30   Lala Lund M.D on 01/03/2020 at 11:50 AM  To page go to [email or web address] - password [facility]  [facility] -  Office  [phone]     See all Orders from today for further details    Objective:   Vitals:   01/02/20 2043 01/03/20 0035 01/03/20 0350 01/03/20 0430  BP:  138/84 (!) 141/79   Pulse: 95 72 78 67  Resp: 14 (!) 24 (!) 27 (!) 24  Temp:  98.4 F (36.9 C) (!) 97.4 F (36.3 C)   TempSrc:  Oral Oral   SpO2: 91% 99% (!) 86% 93%  Weight:      Height:        Wt Readings from Last 3 Encounters:  12/31/19 125.9 kg  11/23/16 126.9 kg  07/25/16 127.9 kg     Intake/Output Summary (Last 24 hours) at 01/03/2020 1150 Last data filed at 01/03/2020 0915 Gross per 24 hour  Intake 483 ml  Output 2025 ml  Net -1542 ml     Physical Exam  Awake Alert, No new F.N deficits, Normal affect Venus.AT,PERRAL Supple Neck,No JVD, No cervical lymphadenopathy appriciated.  Symmetrical Chest wall movement, Good air movement bilaterally, CTAB RRR,No Gallops, Rubs or new Murmurs, No Parasternal Heave +ve B.Sounds, Abd Soft, No tenderness, No organomegaly appriciated, No rebound - guarding or rigidity. No Cyanosis, Clubbing or edema, No new Rash or bruise     Data Review:    CBC Recent Labs  Lab 12/30/19 1040 12/31/19 0357 01/01/20 0525 01/02/20 0409 01/03/20 0416  WBC 13.7* 11.4* 11.0* 11.4* 16.6*  HGB 14.3 14.6 14.8 15.1 15.4  HCT 43.6 44.5 45.0 45.8 46.9  PLT 297 278 286 358 371  MCV 92.8 93.3 92.0 92.0 93.8  MCH 30.4 30.6 30.3 30.3 30.8  MCHC 32.8 32.8 32.9 33.0 32.8  RDW 13.7 14.0 13.7 13.8 13.9  LYMPHSABS 2.0 0.8 1.0 1.2 1.4  MONOABS 1.0 0.6 0.8 1.0 1.4*  EOSABS 0.0 0.0 0.0 0.0 0.0  BASOSABS 0.0 0.0 0.0 0.0 0.0    Chemistries  Recent Labs  Lab 12/30/19 1040 12/30/19 1645 12/31/19 0357 01/01/20 0525 01/02/20 0409 01/03/20 0416  NA 139  --  138 138 138 137  K 3.5  --  3.7 4.2 3.8 4.3  CL 102  --  100 104 100 99  CO2 26  --  25  25 26 29   GLUCOSE 108*  --  141* 152* 155* 155*  BUN 9  --  9 8 17 18   CREATININE 1.23  --  0.89 0.86 0.87 0.95  CALCIUM 7.9*  --  8.3* 8.6* 8.7* 8.7*  AST 47*  --  31 46* 44* 42*  ALT 50*  --  47* 61* 66* 79*  ALKPHOS 50  --  55 56 55  56  BILITOT 1.2  --  0.9 0.7 0.8 0.9  MG 1.8  --  2.1 2.2 2.3 2.4  TSH  --  1.417  --   --   --   --      ------------------------------------------------------------------------------------------------------------------ No results for input(s): CHOL, HDL, LDLCALC, TRIG, CHOLHDL, LDLDIRECT in the last 72 hours.  No results found for: HGBA1C ------------------------------------------------------------------------------------------------------------------ No results for input(s): TSH, T4TOTAL, T3FREE, THYROIDAB in the last 72 hours.  Invalid input(s): FREET3  Cardiac Enzymes No results for input(s): CKMB, TROPONINI, MYOGLOBIN in the last 168 hours.  Invalid input(s): CK ------------------------------------------------------------------------------------------------------------------    Component Value Date/Time   BNP 33.2 01/03/2020 0416    Micro Results Recent Results (from the past 240 hour(s))  SARS Coronavirus 2 Ag (30 min TAT) - Nasal Swab (BD Veritor Kit)     Status: Abnormal   Collection Time: 12/28/19  3:33 PM   Specimen: Nasal Swab (BD Veritor Kit)  Result Value Ref Range Status   SARS Coronavirus 2 Ag POSITIVE (A) NEGATIVE Final    Comment: RESULT CALLED TO, READ BACK BY AND VERIFIED WITH: PEGRAM JERI RN [phone] [id number] PHILLIPS C (NOTE) SARS-CoV-2 antigen PRESENT. Positive results indicate the presence of viral antigens, but clinical correlation with patient history and other diagnostic information is necessary to determine patient infection status.  Positive results do not rule out bacterial infection or co-infection  with other viruses. False positive results are rare but can occur, and confirmatory RT-PCR testing may be  appropriate in some circumstances. The expected result is Negative. Fact Sheet for Patients: PodPark.tn Fact Sheet for Providers: GiftContent.is  This test is not yet approved or cleared by the [place] FDA and  has been authorized for detection and/or diagnosis of SARS-CoV-2 by FDA under an Emergency Use Authorization (EUA).  This EUA will remain in effect (meaning this test can be used) for the duration of  the COVID-19 decla ration under Section 564(b)(1) of the Act, 21 U.S.C. section 360bbb-3(b)(1), unless the authorization is terminated or revoked sooner. Performed at [facility], [address] [place], [place] [address]     Radiology Reports DG Chest [place] 1 View  Result Date: 01/01/2020 CLINICAL DATA:  Shortness of breath, COVID positive EXAM: PORTABLE CHEST 1 VIEW COMPARISON:  12/30/2019 FINDINGS: Low lung volumes. Cardiomegaly. Patchy bilateral airspace disease worsening since prior study. No effusions or pneumothorax. No acute bony abnormality. IMPRESSION: Low lung volumes with worsening patchy bilateral airspace disease/pneumonia. Electronically Signed   By: Rolm Baptise M.D.   On: 01/01/2020 08:56   DG Chest Port 1 View  Result Date: 12/30/2019 CLINICAL DATA:  Shortness of breath, recent COVID positive EXAM: PORTABLE CHEST 1 VIEW COMPARISON:  12/28/2019 FINDINGS: Low lung volumes. Persistent bilateral opacities, greatest at the left lung base. No significant pleural effusion. No pneumothorax. Stable cardiomediastinal contours. IMPRESSION: Similar appearance of bilateral opacities likely reflecting pneumonia. Electronically Signed   By: Macy Mis M.D.   On: 12/30/2019 11:11   DG Chest Portable 1 View  Result Date: 12/28/2019 CLINICAL DATA:  Congestion, cough, fever EXAM: PORTABLE CHEST 1 VIEW COMPARISON:  01/31/2018 FINDINGS: Single frontal view of the chest demonstrates a stable cardiac silhouette.  Interval development of multifocal bilateral airspace disease greatest in the right upper and left lower lung zones. No effusion or pneumothorax. No acute bony abnormality. IMPRESSION: 1. Multifocal bilateral pneumonia. Electronically Signed   By: Randa Ngo M.D.   On: 12/28/2019 16:19   ECHOCARDIOGRAM LIMITED  Result Date: 12/31/2019    ECHOCARDIOGRAM REPORT   Patient Name:   LYRICK LAGRAND Date of Exam: 12/31/2019 Medical Rec #:  [id number]        Height:       71.0 in Accession #:    [id number]       Weight:       277.5 lb Date of Birth:  13-May-1966        BSA:          2.423 m Patient Age:    63 years         BP:           119/67 mmHg Patient Gender: M                HR:           79 bpm. Exam Location:  Inpatient Procedure: 2D Echo, Intracardiac Opacification Agent, Cardiac Doppler and Color            Doppler Indications:    Atrial fibrillation 427.31/I48.91  History:        Patient has no prior history of Echocardiogram examinations.                 Risk Factors:Hypertension, Dyslipidemia and Sleep Apnea.                 Elevated troponin.  Sonographer:    Clayton Lefort RDCS (AE) Referring Phys: [id number] [facility] A SMITH  Sonographer Comments: Patient is morbidly obese and suboptimal apical window. Image acquisition challenging due to patient body habitus. IMPRESSIONS  1. Definity used; mild to moderate global reduction in LV systolic function; grade 2 diastolic dysfunction; mild LVE; mild LVH.  2. Left ventricular ejection fraction, by estimation, is 40 to 45%. The left ventricle has mildly decreased function. The left ventricle demonstrates global hypokinesis. The left ventricular internal cavity size was mildly dilated. There is mild left ventricular hypertrophy. Left ventricular diastolic parameters are consistent with Grade II diastolic dysfunction (pseudonormalization).  3. Right ventricular systolic function is normal. The right ventricular size is normal.  4. The mitral valve is normal in  structure. No evidence of mitral valve regurgitation. No evidence of mitral stenosis.  5. The aortic valve is tricuspid. Aortic valve regurgitation is not visualized. No aortic stenosis is present.  6. The inferior vena cava is normal in size with greater than 50% respiratory variability, suggesting right atrial pressure of 3 mmHg. FINDINGS  Left Ventricle: Left ventricular ejection fraction, by estimation, is 40 to 45%. The left ventricle has mildly decreased function. The left ventricle demonstrates global hypokinesis. Definity contrast agent was given IV to delineate the left ventricular  endocardial borders. The left ventricular internal cavity size was mildly dilated. There is mild left ventricular hypertrophy. Left ventricular diastolic parameters are consistent with Grade II diastolic dysfunction (pseudonormalization). Right Ventricle: The right ventricular size is normal.Right ventricular systolic function is normal. Left Atrium: Left atrial size was normal in size. Right Atrium: Right atrial size was normal in size. Pericardium: There is no evidence of pericardial effusion. Mitral Valve: The mitral valve is normal in structure. Normal mobility of the mitral valve leaflets. No evidence of mitral valve regurgitation. No evidence of mitral valve stenosis. Tricuspid Valve: The tricuspid valve is normal in structure. Tricuspid valve regurgitation is trivial. No evidence of tricuspid stenosis. Aortic Valve: The aortic valve is tricuspid. Aortic valve regurgitation is not visualized. No aortic stenosis is present. Pulmonic Valve: The pulmonic valve was not well  visualized. Pulmonic valve regurgitation is not visualized. No evidence of pulmonic stenosis. Aorta: The aortic root is normal in size and structure. Venous: The inferior vena cava is normal in size with greater than 50% respiratory variability, suggesting right atrial pressure of 3 mmHg. IAS/Shunts: No atrial level shunt detected by color flow Doppler.  Additional Comments: Definity used; mild to moderate global reduction in LV systolic function; grade 2 diastolic dysfunction; mild LVE; mild LVH.  LEFT VENTRICLE PLAX 2D LVIDd:         5.66 cm      Diastology LVIDs:         4.68 cm      LV e' lateral:   9.03 cm/s LV PW:         1.20 cm      LV E/e' lateral: 9.6 LV IVS:        1.17 cm      LV e' medial:    7.83 cm/s LVOT diam:     2.20 cm      LV E/e' medial:  11.1 LV SV:         54 LV SV Index:   22 LVOT Area:     3.80 cm  LV Volumes (MOD) LV vol d, MOD A4C: 139.0 ml LV vol s, MOD A4C: 75.2 ml LV SV MOD A4C:     139.0 ml IVC IVC diam: 1.78 cm LEFT ATRIUM           Index LA Vol (A2C): 36.7 ml 15.14 ml/m LA Vol (A4C): 58.9 ml 24.30 ml/m  AORTIC VALVE LVOT Vmax:   78.30 cm/s LVOT Vmean:  51.900 cm/s LVOT VTI:    0.142 m  AORTA Ao Root diam: 3.60 cm Ao Asc diam:  3.30 cm MITRAL VALVE MV Area (PHT): 4.29 cm    SHUNTS MV Decel Time: 177 msec    Systemic VTI:  0.14 m MV E velocity: 87.00 cm/s  Systemic Diam: 2.20 cm MV A velocity: 62.10 cm/s MV E/A ratio:  1.40 Kirk Ruths MD Electronically signed by Kirk Ruths MD Signature Date/Time: 12/31/2019/2:30:12 PM    Final

## 2020-01-03 NOTE — Plan of Care (Signed)
  Problem: Clinical Measurements: Goal: Respiratory complications will improve Outcome: Progressing   

## 2020-01-04 ENCOUNTER — Inpatient Hospital Stay (HOSPITAL_COMMUNITY): Payer: 59

## 2020-01-04 LAB — CBC WITH DIFFERENTIAL/PLATELET
Abs Immature Granulocytes: 0.23 10*3/uL — ABNORMAL HIGH (ref 0.00–0.07)
Basophils Absolute: 0 10*3/uL (ref 0.0–0.1)
Basophils Relative: 0 %
Eosinophils Absolute: 0 10*3/uL (ref 0.0–0.5)
Eosinophils Relative: 0 %
HCT: 47.1 % (ref 39.0–52.0)
Hemoglobin: 15.4 g/dL (ref 13.0–17.0)
Immature Granulocytes: 1 %
Lymphocytes Relative: 9 %
Lymphs Abs: 1.5 10*3/uL (ref 0.7–4.0)
MCH: 30.2 pg (ref 26.0–34.0)
MCHC: 32.7 g/dL (ref 30.0–36.0)
MCV: 92.4 fL (ref 80.0–100.0)
Monocytes Absolute: 1.3 10*3/uL — ABNORMAL HIGH (ref 0.1–1.0)
Monocytes Relative: 8 %
Neutro Abs: 13.6 10*3/uL — ABNORMAL HIGH (ref 1.7–7.7)
Neutrophils Relative %: 82 %
Platelets: 387 10*3/uL (ref 150–400)
RBC: 5.1 MIL/uL (ref 4.22–5.81)
RDW: 13.7 % (ref 11.5–15.5)
WBC: 16.7 10*3/uL — ABNORMAL HIGH (ref 4.0–10.5)
nRBC: 0 % (ref 0.0–0.2)

## 2020-01-04 LAB — COMPREHENSIVE METABOLIC PANEL
ALT: 71 U/L — ABNORMAL HIGH (ref 0–44)
AST: 27 U/L (ref 15–41)
Albumin: 2.7 g/dL — ABNORMAL LOW (ref 3.5–5.0)
Alkaline Phosphatase: 58 U/L (ref 38–126)
Anion gap: 9 (ref 5–15)
BUN: 16 mg/dL (ref 6–20)
CO2: 27 mmol/L (ref 22–32)
Calcium: 8.5 mg/dL — ABNORMAL LOW (ref 8.9–10.3)
Chloride: 101 mmol/L (ref 98–111)
Creatinine, Ser: 0.85 mg/dL (ref 0.61–1.24)
GFR calc Af Amer: >60 mL/min (ref >60)
GFR calc non Af Amer: >60 mL/min (ref >60)
Glucose, Bld: 150 mg/dL — ABNORMAL HIGH (ref 70–99)
Potassium: 4 mmol/L (ref 3.5–5.1)
Sodium: 137 mmol/L (ref 135–145)
Total Bilirubin: 0.9 mg/dL (ref 0.3–1.2)
Total Protein: 6.6 g/dL (ref 6.5–8.1)

## 2020-01-04 LAB — D-DIMER, QUANTITATIVE: D-Dimer, Quant: 0.75 ug/mL-FEU — ABNORMAL HIGH (ref 0.00–0.50)

## 2020-01-04 LAB — GLUCOSE, CAPILLARY
Glucose-Capillary: 117 mg/dL — ABNORMAL HIGH (ref 70–99)
Glucose-Capillary: 143 mg/dL — ABNORMAL HIGH (ref 70–99)
Glucose-Capillary: 167 mg/dL — ABNORMAL HIGH (ref 70–99)
Glucose-Capillary: 172 mg/dL — ABNORMAL HIGH (ref 70–99)

## 2020-01-04 LAB — C-REACTIVE PROTEIN: CRP: 1.1 mg/dL — ABNORMAL HIGH (ref <1.0)

## 2020-01-04 LAB — MAGNESIUM: Magnesium: 2.5 mg/dL — ABNORMAL HIGH (ref 1.7–2.4)

## 2020-01-04 LAB — BRAIN NATRIURETIC PEPTIDE: B Natriuretic Peptide: 44.7 pg/mL (ref 0.0–100.0)

## 2020-01-04 MED ORDER — FUROSEMIDE 80 MG PO TABS
80.0000 mg | ORAL_TABLET | Freq: Once | ORAL | Status: AC
  Administered 2020-01-04: 10:00:00 80 mg via ORAL
  Filled 2020-01-04: qty 1

## 2020-01-04 MED ORDER — METHYLPREDNISOLONE SODIUM SUCC 40 MG IJ SOLR
40.0000 mg | Freq: Every day | INTRAMUSCULAR | Status: DC
  Administered 2020-01-05: 10:00:00 40 mg via INTRAVENOUS
  Filled 2020-01-04: qty 1

## 2020-01-04 NOTE — Progress Notes (Signed)
PROGRESS NOTE                                                                                                                                                                                                             Patient Demographics:    Jared Lopez, is a 53 y.o. male, DOB - Mar 15, 1966, FXO:[id number]  Outpatient Primary MD for the patient is Iona Beard, MD    LOS - 5  Admit date - 12/30/2019    Chief Complaint  Patient presents with  . Atrial Fibrillation  . Covid Exposure       Brief Narrative  Jared Lopez is a 54 y.o. male with medical history significant of hypertension, hyperlipidemia, obesity, BPH, and OSA on CPAP presents with complaints of shortness of breath.  He had not been feeling well over the last 1-2 weeks with reports of a dry cough, in the ER he was diagnosed with COVID-19 pneumonia along with A. fib RVR and admitted to the hospital.   Subjective:   Patient in bed, appears comfortable, denies any headache, no fever, no chest pain or pressure, improved shortness of breath , no abdominal pain. No focal weakness.   Assessment  & Plan :     1.  Acute Covid 19 Viral Pneumonitis during the ongoing 2020 Covid 19 Pandemic - he has severe disease and received IV steroids, remdesivir along with Actemra appropriately, he is now gradually showing signs of clinical improvement was on 10 L nasal cannula oxygen and now down to 5, titrate Steroids down, continue to advance activity and titrate down oxygen and monitor closely.    Encouraged the patient to sit up in chair in the daytime use I-S and flutter valve for pulmonary toiletry and then prone in bed when at night.       Recent Labs  Lab 12/30/19 1040 12/30/19 1645 12/31/19 0357 01/01/20 0525 01/02/20 0409 01/03/20 0416 01/04/20 0444  CRP  --   --  12.9* 8.0* 5.1* 2.3* 1.1*  DDIMER   < > 0.60* 0.95* 0.74* 0.78* 0.71* 0.75*  BNP   < >  --   56.1 36.9 26.3 33.2 44.7  PROCALCITON  --  <0.10 <0.10 <0.10 <0.10  --   --    < > = values in this interval not displayed.    Hepatic Function Latest  Ref Rng & Units 01/04/2020 01/03/2020 01/02/2020  Total Protein 6.5 - 8.1 g/dL 6.6 6.8 7.0  Albumin 3.5 - 5.0 g/dL 2.7(L) 2.8(L) 2.7(L)  AST 15 - 41 U/L 27 42(H) 44(H)  ALT 0 - 44 U/L 71(H) 79(H) 66(H)  Alk Phosphatase 38 - 126 U/L 58 56 55  Total Bilirubin 0.3 - 1.2 mg/dL 0.9 0.9 0.8   Lab Results  Component Value Date   TSH 1.417 12/30/2019    2.  Paroxysmal A. fib versus new onset A. fib RVR.  Mali vas 2 score of  2 -   initially on Cardizem drip now on oral Lopressor and Eliquis combination, case discussed with his primary cardiologist Dr. Einar Gip who will follow the patient in the office post discharge.   3.  Mild COVID-19 viral infection related transaminitis.  Asymptomatic with stable trend.  Monitor.    4. Obesity.  BMI 38.  Follow with PCP.  5.  BPH.  On Flomax.  6.  OSA.  CPAP at night.  7.  Mild acute on chronic systolic and diastolic CHF EF around 71%.  Continue beta-blocker along with Lasix for diuresis PRN.  Outpatient follow-up with his primary cardiologist Dr. Einar Gip post discharge.  Now appears to be compensated.    Condition - Fair  Family Communication  : Spouse Jared Lopez [phone] on 12/31/2019, 01/01/20, 01/03/20  Code Status :  Full  Diet :   Diet Order            Diet Heart Room service appropriate? Yes; Fluid consistency: Thin  Diet effective now               Disposition Plan  :  Stay inpatient for treatment of COVID-19 pneumonia with IV REMDESIVIR  Consults  :  Cards  Procedures  :    TTE -   1. Definity used; mild to moderate global reduction in LV systolic function; grade 2 diastolic dysfunction; mild LVE; mild LVH.  2. Left ventricular ejection fraction, by estimation, is 40 to 45%. The left ventricle has mildly decreased function. The left ventricle demonstrates global hypokinesis. The  left ventricular internal cavity size was mildly dilated. There is mild left ventricular hypertrophy. Left ventricular diastolic parameters are consistent with Grade II diastolic dysfunction (pseudonormalization).  3. Right ventricular systolic function is normal. The right ventricular size is normal.  4. The mitral valve is normal in structure. No evidence of mitral valve regurgitation. No evidence of mitral stenosis.  5. The aortic valve is tricuspid. Aortic valve regurgitation is not visualized. No aortic stenosis is present.  6. The inferior vena cava is normal in size with greater than 50% respiratory variability, suggesting right atrial pressure of 3 mmHg.   PUD Prophylaxis :   DVT Prophylaxis  :  Lovenox    Lab Results  Component Value Date   PLT 387 01/04/2020    Inpatient Medications  Scheduled Meds: . albuterol  2 puff Inhalation Q6H  . apixaban  5 mg Oral BID  . vitamin C  500 mg Oral Daily  . famotidine  20 mg Oral BID  . furosemide  80 mg Oral Once  . insulin aspart  0-9 Units Subcutaneous Q6H  . isosorbide mononitrate  20 mg Oral Daily  . methylPREDNISolone (SOLU-MEDROL) injection  60 mg Intravenous Q12H  . metoprolol tartrate  50 mg Oral BID  . sodium chloride flush  3 mL Intravenous Q12H  . tamsulosin  0.4 mg Oral PC supper  . zinc sulfate  220 mg Oral Daily   Continuous Infusions:  PRN Meds:.acetaminophen, chlorpheniramine-HYDROcodone, guaiFENesin-dextromethorphan, [DISCONTINUED] ondansetron **OR** ondansetron (ZOFRAN) IV  Antibiotics  :    Anti-infectives (From admission, onward)   Start     Dose/Rate Route Frequency Ordered Stop   12/31/19 1000  remdesivir 100 mg in sodium chloride 0.9 % 100 mL IVPB     100 mg 200 mL/hr over 30 Minutes Intravenous Daily 12/30/19 1529 01/03/20 1855   12/30/19 1630  remdesivir 200 mg in sodium chloride 0.9% 250 mL IVPB     200 mg 580 mL/hr over 30 Minutes Intravenous Once 12/30/19 1529 12/30/19 1717       Time  Spent in minutes  30   Lala Lund M.D on 01/04/2020 at 9:21 AM  To page go to [email or web address] - password [email or web address]  [facility] -  Office  [phone]     See all Orders from today for further details    Objective:   Vitals:   01/03/20 2355 01/04/20 0046 01/04/20 0354 01/04/20 0745  BP: (!) 152/92  (!) 153/82 (!) 153/92  Pulse: 70 69 74 81  Resp: 19 20 (!) 28 20  Temp: 97.7 F (36.5 C)  97.9 F (36.6 C) 98 F (36.7 C)  TempSrc: Oral  Oral Oral  SpO2: (!) 87% 97% (!) 87% (!) 82%  Weight:      Height:        Wt Readings from Last 3 Encounters:  12/31/19 125.9 kg  11/23/16 126.9 kg  07/25/16 127.9 kg     Intake/Output Summary (Last 24 hours) at 01/04/2020 0921 Last data filed at 01/04/2020 0220 Gross per 24 hour  Intake 460 ml  Output 550 ml  Net -90 ml     Physical Exam  Awake Alert, No new F.N deficits, Normal affect [place].AT,PERRAL Supple Neck,No JVD, No cervical lymphadenopathy appriciated.  Symmetrical Chest wall movement, Good air movement bilaterally, few rales RRR,No Gallops, Rubs or new Murmurs, No Parasternal Heave +ve B.Sounds, Abd Soft, No tenderness, No organomegaly appriciated, No rebound - guarding or rigidity. No Cyanosis, Clubbing or edema, No new Rash or bruise   Data Review:    CBC Recent Labs  Lab 12/31/19 0357 01/01/20 0525 01/02/20 0409 01/03/20 0416 01/04/20 0444  WBC 11.4* 11.0* 11.4* 16.6* 16.7*  HGB 14.6 14.8 15.1 15.4 15.4  HCT 44.5 45.0 45.8 46.9 47.1  PLT 278 286 358 371 387  MCV 93.3 92.0 92.0 93.8 92.4  MCH 30.6 30.3 30.3 30.8 30.2  MCHC 32.8 32.9 33.0 32.8 32.7  RDW 14.0 13.7 13.8 13.9 13.7  LYMPHSABS 0.8 1.0 1.2 1.4 1.5  MONOABS 0.6 0.8 1.0 1.4* 1.3*  EOSABS 0.0 0.0 0.0 0.0 0.0  BASOSABS 0.0 0.0 0.0 0.0 0.0    Chemistries  Recent Labs  Lab 12/30/19 1040 12/30/19 1645 12/31/19 0357 01/01/20 0525 01/02/20 0409 01/03/20 0416 01/04/20 0444  NA   < >  --  138 138 138 137 137  K   < >  --  3.7 4.2 3.8 4.3  4.0  CL   < >  --  100 104 100 99 101  CO2   < >  --  25 25 26 29 27   GLUCOSE   < >  --  141* 152* 155* 155* 150*  BUN   < >  --  9 8 17 18 16   CREATININE   < >  --  0.89 0.86 0.87 0.95 0.85  CALCIUM   < >  --  8.3* 8.6* 8.7* 8.7* 8.5*  AST   < >  --  31 46* 44* 42* 27  ALT   < >  --  47* 61* 66* 79* 71*  ALKPHOS   < >  --  55 56 55 56 58  BILITOT   < >  --  0.9 0.7 0.8 0.9 0.9  MG   < >  --  2.1 2.2 2.3 2.4 2.5*  TSH  --  1.417  --   --   --   --   --    < > = values in this interval not displayed.     ------------------------------------------------------------------------------------------------------------------ No results for input(s): CHOL, HDL, LDLCALC, TRIG, CHOLHDL, LDLDIRECT in the last 72 hours.  No results found for: HGBA1C ------------------------------------------------------------------------------------------------------------------ No results for input(s): TSH, T4TOTAL, T3FREE, THYROIDAB in the last 72 hours.  Invalid input(s): FREET3  Cardiac Enzymes No results for input(s): CKMB, TROPONINI, MYOGLOBIN in the last 168 hours.  Invalid input(s): CK ------------------------------------------------------------------------------------------------------------------    Component Value Date/Time   BNP 44.7 01/04/2020 0444    Micro Results Recent Results (from the past 240 hour(s))  SARS Coronavirus 2 Ag (30 min TAT) - Nasal Swab (BD Veritor Kit)     Status: Abnormal   Collection Time: 12/28/19  3:33 PM   Specimen: Nasal Swab (BD Veritor Kit)  Result Value Ref Range Status   SARS Coronavirus 2 Ag POSITIVE (A) NEGATIVE Final    Comment: RESULT CALLED TO, READ BACK BY AND VERIFIED WITH: PEGRAM JERI RN [phone] [id number] PHILLIPS C (NOTE) SARS-CoV-2 antigen PRESENT. Positive results indicate the presence of viral antigens, but clinical correlation with patient history and other diagnostic information is necessary to determine patient infection status.  Positive results  do not rule out bacterial infection or co-infection  with other viruses. False positive results are rare but can occur, and confirmatory RT-PCR testing may be appropriate in some circumstances. The expected result is Negative. Fact Sheet for Patients: PodPark.tn Fact Sheet for Providers: GiftContent.is  This test is not yet approved or cleared by the [place] FDA and  has been authorized for detection and/or diagnosis of SARS-CoV-2 by FDA under an Emergency Use Authorization (EUA).  This EUA will remain in effect (meaning this test can be used) for the duration of  the COVID-19 decla ration under Section 564(b)(1) of the Act, 21 U.S.C. section 360bbb-3(b)(1), unless the authorization is terminated or revoked sooner. Performed at [facility], [address], [place], [place] [address]     Radiology Reports DG Chest [place] 1 View  Result Date: 01/01/2020 CLINICAL DATA:  Shortness of breath, COVID positive EXAM: PORTABLE CHEST 1 VIEW COMPARISON:  12/30/2019 FINDINGS: Low lung volumes. Cardiomegaly. Patchy bilateral airspace disease worsening since prior study. No effusions or pneumothorax. No acute bony abnormality. IMPRESSION: Low lung volumes with worsening patchy bilateral airspace disease/pneumonia. Electronically Signed   By: Rolm Baptise M.D.   On: 01/01/2020 08:56   DG Chest Port 1 View  Result Date: 12/30/2019 CLINICAL DATA:  Shortness of breath, recent COVID positive EXAM: PORTABLE CHEST 1 VIEW COMPARISON:  12/28/2019 FINDINGS: Low lung volumes. Persistent bilateral opacities, greatest at the left lung base. No significant pleural effusion. No pneumothorax. Stable cardiomediastinal contours. IMPRESSION: Similar appearance of bilateral opacities likely reflecting pneumonia. Electronically Signed   By: Macy Mis M.D.   On: 12/30/2019 11:11   DG Chest Portable 1 View  Result Date: 12/28/2019 CLINICAL DATA:   Congestion, cough, fever EXAM:  PORTABLE CHEST 1 VIEW COMPARISON:  01/31/2018 FINDINGS: Single frontal view of the chest demonstrates a stable cardiac silhouette. Interval development of multifocal bilateral airspace disease greatest in the right upper and left lower lung zones. No effusion or pneumothorax. No acute bony abnormality. IMPRESSION: 1. Multifocal bilateral pneumonia. Electronically Signed   By: Randa Ngo M.D.   On: 12/28/2019 16:19   ECHOCARDIOGRAM LIMITED  Result Date: 12/31/2019    ECHOCARDIOGRAM REPORT   Patient Name:   GRAYER SPROLES Date of Exam: 12/31/2019 Medical Rec #:  [id number]        Height:       71.0 in Accession #:    [id number]       Weight:       277.5 lb Date of Birth:  Dec 09, 1965        BSA:          2.423 m Patient Age:    56 years         BP:           119/67 mmHg Patient Gender: M                HR:           79 bpm. Exam Location:  Inpatient Procedure: 2D Echo, Intracardiac Opacification Agent, Cardiac Doppler and Color            Doppler Indications:    Atrial fibrillation 427.31/I48.91  History:        Patient has no prior history of Echocardiogram examinations.                 Risk Factors:Hypertension, Dyslipidemia and Sleep Apnea.                 Elevated troponin.  Sonographer:    Clayton Lefort RDCS (AE) Referring Phys: [id number] [facility] A SMITH  Sonographer Comments: Patient is morbidly obese and suboptimal apical window. Image acquisition challenging due to patient body habitus. IMPRESSIONS  1. Definity used; mild to moderate global reduction in LV systolic function; grade 2 diastolic dysfunction; mild LVE; mild LVH.  2. Left ventricular ejection fraction, by estimation, is 40 to 45%. The left ventricle has mildly decreased function. The left ventricle demonstrates global hypokinesis. The left ventricular internal cavity size was mildly dilated. There is mild left ventricular hypertrophy. Left ventricular diastolic parameters are consistent with Grade II diastolic  dysfunction (pseudonormalization).  3. Right ventricular systolic function is normal. The right ventricular size is normal.  4. The mitral valve is normal in structure. No evidence of mitral valve regurgitation. No evidence of mitral stenosis.  5. The aortic valve is tricuspid. Aortic valve regurgitation is not visualized. No aortic stenosis is present.  6. The inferior vena cava is normal in size with greater than 50% respiratory variability, suggesting right atrial pressure of 3 mmHg. FINDINGS  Left Ventricle: Left ventricular ejection fraction, by estimation, is 40 to 45%. The left ventricle has mildly decreased function. The left ventricle demonstrates global hypokinesis. Definity contrast agent was given IV to delineate the left ventricular  endocardial borders. The left ventricular internal cavity size was mildly dilated. There is mild left ventricular hypertrophy. Left ventricular diastolic parameters are consistent with Grade II diastolic dysfunction (pseudonormalization). Right Ventricle: The right ventricular size is normal.Right ventricular systolic function is normal. Left Atrium: Left atrial size was normal in size. Right Atrium: Right atrial size was normal in size. Pericardium: There is no evidence of pericardial effusion. Mitral Valve: The mitral valve  is normal in structure. Normal mobility of the mitral valve leaflets. No evidence of mitral valve regurgitation. No evidence of mitral valve stenosis. Tricuspid Valve: The tricuspid valve is normal in structure. Tricuspid valve regurgitation is trivial. No evidence of tricuspid stenosis. Aortic Valve: The aortic valve is tricuspid. Aortic valve regurgitation is not visualized. No aortic stenosis is present. Pulmonic Valve: The pulmonic valve was not well visualized. Pulmonic valve regurgitation is not visualized. No evidence of pulmonic stenosis. Aorta: The aortic root is normal in size and structure. Venous: The inferior vena cava is normal in size  with greater than 50% respiratory variability, suggesting right atrial pressure of 3 mmHg. IAS/Shunts: No atrial level shunt detected by color flow Doppler. Additional Comments: Definity used; mild to moderate global reduction in LV systolic function; grade 2 diastolic dysfunction; mild LVE; mild LVH.  LEFT VENTRICLE PLAX 2D LVIDd:         5.66 cm      Diastology LVIDs:         4.68 cm      LV e' lateral:   9.03 cm/s LV PW:         1.20 cm      LV E/e' lateral: 9.6 LV IVS:        1.17 cm      LV e' medial:    7.83 cm/s LVOT diam:     2.20 cm      LV E/e' medial:  11.1 LV SV:         54 LV SV Index:   22 LVOT Area:     3.80 cm  LV Volumes (MOD) LV vol d, MOD A4C: 139.0 ml LV vol s, MOD A4C: 75.2 ml LV SV MOD A4C:     139.0 ml IVC IVC diam: 1.78 cm LEFT ATRIUM           Index LA Vol (A2C): 36.7 ml 15.14 ml/m LA Vol (A4C): 58.9 ml 24.30 ml/m  AORTIC VALVE LVOT Vmax:   78.30 cm/s LVOT Vmean:  51.900 cm/s LVOT VTI:    0.142 m  AORTA Ao Root diam: 3.60 cm Ao Asc diam:  3.30 cm MITRAL VALVE MV Area (PHT): 4.29 cm    SHUNTS MV Decel Time: 177 msec    Systemic VTI:  0.14 m MV E velocity: 87.00 cm/s  Systemic Diam: 2.20 cm MV A velocity: 62.10 cm/s MV E/A ratio:  1.40 Kirk Ruths MD Electronically signed by Kirk Ruths MD Signature Date/Time: 12/31/2019/2:30:12 PM    Final

## 2020-01-05 LAB — BASIC METABOLIC PANEL
Anion gap: 12 (ref 5–15)
BUN: 21 mg/dL — ABNORMAL HIGH (ref 6–20)
CO2: 30 mmol/L (ref 22–32)
Calcium: 8.7 mg/dL — ABNORMAL LOW (ref 8.9–10.3)
Chloride: 98 mmol/L (ref 98–111)
Creatinine, Ser: 1.17 mg/dL (ref 0.61–1.24)
GFR calc Af Amer: >60 mL/min (ref >60)
GFR calc non Af Amer: >60 mL/min (ref >60)
Glucose, Bld: 119 mg/dL — ABNORMAL HIGH (ref 70–99)
Potassium: 3.5 mmol/L (ref 3.5–5.1)
Sodium: 140 mmol/L (ref 135–145)

## 2020-01-05 LAB — GLUCOSE, CAPILLARY
Glucose-Capillary: 131 mg/dL — ABNORMAL HIGH (ref 70–99)
Glucose-Capillary: 137 mg/dL — ABNORMAL HIGH (ref 70–99)
Glucose-Capillary: 137 mg/dL — ABNORMAL HIGH (ref 70–99)
Glucose-Capillary: 141 mg/dL — ABNORMAL HIGH (ref 70–99)

## 2020-01-05 MED ORDER — LACTATED RINGERS IV BOLUS
500.0000 mL | Freq: Once | INTRAVENOUS | Status: AC
  Administered 2020-01-05: 15:00:00 500 mL via INTRAVENOUS

## 2020-01-05 MED ORDER — METOPROLOL TARTRATE 50 MG PO TABS: 50.0000 mg | ORAL_TABLET | Freq: Two times a day (BID) | ORAL | Status: DC

## 2020-01-05 MED ORDER — METHYLPREDNISOLONE SODIUM SUCC 40 MG IJ SOLR
20.0000 mg | Freq: Every day | INTRAMUSCULAR | Status: DC
  Administered 2020-01-06: 20 mg via INTRAVENOUS
  Filled 2020-01-05: qty 1

## 2020-01-05 MED ORDER — FUROSEMIDE 40 MG PO TABS
40.0000 mg | ORAL_TABLET | Freq: Once | ORAL | Status: AC
  Administered 2020-01-05: 40 mg via ORAL
  Filled 2020-01-05: qty 1

## 2020-01-05 MED ORDER — POTASSIUM CHLORIDE CRYS ER 20 MEQ PO TBCR
40.0000 meq | EXTENDED_RELEASE_TABLET | Freq: Once | ORAL | Status: AC
  Administered 2020-01-05: 40 meq via ORAL
  Filled 2020-01-05: qty 2

## 2020-01-05 NOTE — Progress Notes (Signed)
PROGRESS NOTE                                                                                                                                                                                                             Patient Demographics:    Jared Lopez, is a 54 y.o. male, DOB - 1965/11/23, TLX:[id number]  Outpatient Primary MD for the patient is Iona Beard, MD    LOS - 6  Admit date - 12/30/2019    Chief Complaint  Patient presents with  . Atrial Fibrillation  . Covid Exposure       Brief Narrative  Jared Lopez is a 54 y.o. male with medical history significant of hypertension, hyperlipidemia, obesity, BPH, and OSA on CPAP presents with complaints of shortness of breath.  He had not been feeling well over the last 1-2 weeks with reports of a dry cough, in the ER he was diagnosed with COVID-19 pneumonia along with A. fib RVR and admitted to the hospital.   Subjective:   Patient in bed, appears comfortable, denies any headache, no fever, no chest pain or pressure, no shortness of breath , no abdominal pain. No focal weakness.   Assessment  & Plan :     1.  Acute Covid 19 Viral Pneumonitis during the ongoing 2020 Covid 19 Pandemic - he has severe disease and received IV steroids, remdesivir along with Actemra appropriately, he is now gradually showing signs of clinical improvement was on 10 L nasal cannula oxygen and now down to 2 L, titrate Steroids down, continue to advance activity and titrate down oxygen and monitor closely.    Encouraged the patient to sit up in chair in the daytime use I-S and flutter valve for pulmonary toiletry and then prone in bed when at night.       Recent Labs  Lab 12/30/19 1040 12/30/19 1645 12/31/19 0357 01/01/20 0525 01/02/20 0409 01/03/20 0416 01/04/20 0444  CRP  --   --  12.9* 8.0* 5.1* 2.3* 1.1*  DDIMER   < > 0.60* 0.95* 0.74* 0.78* 0.71* 0.75*  BNP   < >  --  56.1  36.9 26.3 33.2 44.7  PROCALCITON  --  <0.10 <0.10 <0.10 <0.10  --   --    < > = values in this interval not displayed.    Hepatic Function  Latest Ref Rng & Units 01/04/2020 01/03/2020 01/02/2020  Total Protein 6.5 - 8.1 g/dL 6.6 6.8 7.0  Albumin 3.5 - 5.0 g/dL 2.7(L) 2.8(L) 2.7(L)  AST 15 - 41 U/L 27 42(H) 44(H)  ALT 0 - 44 U/L 71(H) 79(H) 66(H)  Alk Phosphatase 38 - 126 U/L 58 56 55  Total Bilirubin 0.3 - 1.2 mg/dL 0.9 0.9 0.8   Lab Results  Component Value Date   TSH 1.417 12/30/2019    2.  Paroxysmal A. fib versus new onset A. fib RVR.  Mali vas 2 score of  2 -   initially on Cardizem drip now on oral Lopressor and Eliquis combination, case discussed with his primary cardiologist Dr. Einar Gip who will follow the patient in the office post discharge.   3.  Mild COVID-19 viral infection related transaminitis.  Asymptomatic with stable trend.  Monitor.    4. Obesity.  BMI 38.  Follow with PCP.  5.  BPH.  On Flomax.  6.  OSA.  CPAP at night.  7.  Mild acute on chronic systolic and diastolic CHF EF around 09%.  Continue beta-blocker along with Lasix for diuresis PRN.  Outpatient follow-up with his primary cardiologist Dr. Einar Gip post discharge.  Now appears to be compensated.    Condition - Fair  Family Communication  : Spouse Alwyn Ren [phone] on 12/31/2019, 01/01/20, 01/03/20  Code Status :  Full  Diet :   Diet Order            Diet Heart Room service appropriate? Yes; Fluid consistency: Thin  Diet effective now               Disposition Plan  :  Stay inpatient for treatment of COVID-19 pneumonia with IV REMDESIVIR, still recovering from severe hypoxic respiratory failure, if stable likely discharge home on 01/06/2020 May require home oxygen.  Consults  :  Cards  Procedures  :    TTE -   1. Definity used; mild to moderate global reduction in LV systolic function; grade 2 diastolic dysfunction; mild LVE; mild LVH.  2. Left ventricular ejection fraction, by estimation,  is 40 to 45%. The left ventricle has mildly decreased function. The left ventricle demonstrates global hypokinesis. The left ventricular internal cavity size was mildly dilated. There is mild left ventricular hypertrophy. Left ventricular diastolic parameters are consistent with Grade II diastolic dysfunction (pseudonormalization).  3. Right ventricular systolic function is normal. The right ventricular size is normal.  4. The mitral valve is normal in structure. No evidence of mitral valve regurgitation. No evidence of mitral stenosis.  5. The aortic valve is tricuspid. Aortic valve regurgitation is not visualized. No aortic stenosis is present.  6. The inferior vena cava is normal in size with greater than 50% respiratory variability, suggesting right atrial pressure of 3 mmHg.   PUD Prophylaxis :   DVT Prophylaxis  :  Lovenox    Lab Results  Component Value Date   PLT 387 01/04/2020    Inpatient Medications  Scheduled Meds: . albuterol  2 puff Inhalation Q6H  . apixaban  5 mg Oral BID  . vitamin C  500 mg Oral Daily  . famotidine  20 mg Oral BID  . furosemide  40 mg Oral Once  . insulin aspart  0-9 Units Subcutaneous Q6H  . isosorbide mononitrate  20 mg Oral Daily  . [START ON 01/06/2020] methylPREDNISolone (SOLU-MEDROL) injection  20 mg Intravenous Daily  . metoprolol tartrate  50 mg Oral BID  .  potassium chloride  40 mEq Oral Once  . sodium chloride flush  3 mL Intravenous Q12H  . tamsulosin  0.4 mg Oral PC supper  . zinc sulfate  220 mg Oral Daily   Continuous Infusions:  PRN Meds:.acetaminophen, chlorpheniramine-HYDROcodone, guaiFENesin-dextromethorphan, [DISCONTINUED] ondansetron **OR** ondansetron (ZOFRAN) IV  Antibiotics  :    Anti-infectives (From admission, onward)   Start     Dose/Rate Route Frequency Ordered Stop   12/31/19 1000  remdesivir 100 mg in sodium chloride 0.9 % 100 mL IVPB     100 mg 200 mL/hr over 30 Minutes Intravenous Daily 12/30/19 1529  01/03/20 1855   12/30/19 1630  remdesivir 200 mg in sodium chloride 0.9% 250 mL IVPB     200 mg 580 mL/hr over 30 Minutes Intravenous Once 12/30/19 1529 12/30/19 1717       Time Spent in minutes  30   Lala Lund M.D on 01/05/2020 at 10:37 AM  To page go to [email or web address] - password [email or web address]  [facility] -  Office  [phone]     See all Orders from today for further details    Objective:   Vitals:   01/04/20 2000 01/05/20 0001 01/05/20 0429 01/05/20 0859  BP:  131/88  135/83  Pulse: 88  81 80  Resp: 20   (!) 27  Temp:  98.1 F (36.7 C) 97.7 F (36.5 C) (!) 97.5 F (36.4 C)  TempSrc:  Oral Oral Tympanic  SpO2: (!) 88%   92%  Weight:      Height:        Wt Readings from Last 3 Encounters:  12/31/19 125.9 kg  11/23/16 126.9 kg  07/25/16 127.9 kg     Intake/Output Summary (Last 24 hours) at 01/05/2020 1037 Last data filed at 01/05/2020 0647 Gross per 24 hour  Intake 210 ml  Output 1950 ml  Net -1740 ml     Physical Exam  Awake Alert, No new F.N deficits, Normal affect [place].AT,PERRAL Supple Neck,No JVD, No cervical lymphadenopathy appriciated.  Symmetrical Chest wall movement, Good air movement bilaterally, CTAB RRR,No Gallops, Rubs or new Murmurs, No Parasternal Heave +ve B.Sounds, Abd Soft, No tenderness, No organomegaly appriciated, No rebound - guarding or rigidity. No Cyanosis, Clubbing or edema, No new Rash or bruise    Data Review:    CBC Recent Labs  Lab 12/31/19 0357 01/01/20 0525 01/02/20 0409 01/03/20 0416 01/04/20 0444  WBC 11.4* 11.0* 11.4* 16.6* 16.7*  HGB 14.6 14.8 15.1 15.4 15.4  HCT 44.5 45.0 45.8 46.9 47.1  PLT 278 286 358 371 387  MCV 93.3 92.0 92.0 93.8 92.4  MCH 30.6 30.3 30.3 30.8 30.2  MCHC 32.8 32.9 33.0 32.8 32.7  RDW 14.0 13.7 13.8 13.9 13.7  LYMPHSABS 0.8 1.0 1.2 1.4 1.5  MONOABS 0.6 0.8 1.0 1.4* 1.3*  EOSABS 0.0 0.0 0.0 0.0 0.0  BASOSABS 0.0 0.0 0.0 0.0 0.0    Chemistries  Recent Labs  Lab  12/30/19 1040 12/30/19 1645 12/31/19 0357 12/31/19 0357 01/01/20 0525 01/02/20 0409 01/03/20 0416 01/04/20 0444 01/05/20 0809  NA   < >  --  138   < > 138 138 137 137 140  K   < >  --  3.7   < > 4.2 3.8 4.3 4.0 3.5  CL   < >  --  100   < > 104 100 99 101 98  CO2   < >  --  25   < > 25 26 29 27  30  GLUCOSE   < >  --  141*   < > 152* 155* 155* 150* 119*  BUN   < >  --  9   < > 8 17 18 16  21*  CREATININE   < >  --  0.89   < > 0.86 0.87 0.95 0.85 1.17  CALCIUM   < >  --  8.3*   < > 8.6* 8.7* 8.7* 8.5* 8.7*  AST   < >  --  31  --  46* 44* 42* 27  --   ALT   < >  --  47*  --  61* 66* 79* 71*  --   ALKPHOS   < >  --  55  --  56 55 56 58  --   BILITOT   < >  --  0.9  --  0.7 0.8 0.9 0.9  --   MG   < >  --  2.1  --  2.2 2.3 2.4 2.5*  --   TSH  --  1.417  --   --   --   --   --   --   --    < > = values in this interval not displayed.     ------------------------------------------------------------------------------------------------------------------ No results for input(s): CHOL, HDL, LDLCALC, TRIG, CHOLHDL, LDLDIRECT in the last 72 hours.  No results found for: HGBA1C ------------------------------------------------------------------------------------------------------------------ No results for input(s): TSH, T4TOTAL, T3FREE, THYROIDAB in the last 72 hours.  Invalid input(s): FREET3  Cardiac Enzymes No results for input(s): CKMB, TROPONINI, MYOGLOBIN in the last 168 hours.  Invalid input(s): CK ------------------------------------------------------------------------------------------------------------------    Component Value Date/Time   BNP 44.7 01/04/2020 0444    Micro Results Recent Results (from the past 240 hour(s))  SARS Coronavirus 2 Ag (30 min TAT) - Nasal Swab (BD Veritor Kit)     Status: Abnormal   Collection Time: 12/28/19  3:33 PM   Specimen: Nasal Swab (BD Veritor Kit)  Result Value Ref Range Status   SARS Coronavirus 2 Ag POSITIVE (A) NEGATIVE Final     Comment: RESULT CALLED TO, READ BACK BY AND VERIFIED WITH: PEGRAM JERI RN [phone] [id number] PHILLIPS C (NOTE) SARS-CoV-2 antigen PRESENT. Positive results indicate the presence of viral antigens, but clinical correlation with patient history and other diagnostic information is necessary to determine patient infection status.  Positive results do not rule out bacterial infection or co-infection  with other viruses. False positive results are rare but can occur, and confirmatory RT-PCR testing may be appropriate in some circumstances. The expected result is Negative. Fact Sheet for Patients: PodPark.tn Fact Sheet for Providers: GiftContent.is  This test is not yet approved or cleared by the [place] FDA and  has been authorized for detection and/or diagnosis of SARS-CoV-2 by FDA under an Emergency Use Authorization (EUA).  This EUA will remain in effect (meaning this test can be used) for the duration of  the COVID-19 decla ration under Section 564(b)(1) of the Act, 21 U.S.C. section 360bbb-3(b)(1), unless the authorization is terminated or revoked sooner. Performed at [facility], [address] [place], [place] [address]     Radiology Reports DG Chest [place] 1 View  Result Date: 01/04/2020 CLINICAL DATA:  Short of breath. Patient is covid postitive EXAM: PORTABLE CHEST 1 VIEW COMPARISON:  Chest radiograph 01/01/2020 FINDINGS: Stable cardiomediastinal contours. Low lung volumes. Aeration of the lungs appears slightly improved bilaterally particularly at the left base. No evidence of pneumothorax. No significant pleural  effusion. There are some subtle linear lucencies at the neck base, partially visualized on the current radiograph which may be artifactual. IMPRESSION: 1. Slightly improved aeration particularly at the left base. 2. Subtle linear lucencies at the neck base may be artifactual. Consider repeat radiograph with a  wider field-of-view to exclude subcutaneous emphysema. Electronically Signed   By: Audie Pinto M.D.   On: 01/04/2020 16:19   DG Chest Port 1 View  Result Date: 01/01/2020 CLINICAL DATA:  Shortness of breath, COVID positive EXAM: PORTABLE CHEST 1 VIEW COMPARISON:  12/30/2019 FINDINGS: Low lung volumes. Cardiomegaly. Patchy bilateral airspace disease worsening since prior study. No effusions or pneumothorax. No acute bony abnormality. IMPRESSION: Low lung volumes with worsening patchy bilateral airspace disease/pneumonia. Electronically Signed   By: Rolm Baptise M.D.   On: 01/01/2020 08:56   DG Chest Port 1 View  Result Date: 12/30/2019 CLINICAL DATA:  Shortness of breath, recent COVID positive EXAM: PORTABLE CHEST 1 VIEW COMPARISON:  12/28/2019 FINDINGS: Low lung volumes. Persistent bilateral opacities, greatest at the left lung base. No significant pleural effusion. No pneumothorax. Stable cardiomediastinal contours. IMPRESSION: Similar appearance of bilateral opacities likely reflecting pneumonia. Electronically Signed   By: Macy Mis M.D.   On: 12/30/2019 11:11   DG Chest Portable 1 View  Result Date: 12/28/2019 CLINICAL DATA:  Congestion, cough, fever EXAM: PORTABLE CHEST 1 VIEW COMPARISON:  01/31/2018 FINDINGS: Single frontal view of the chest demonstrates a stable cardiac silhouette. Interval development of multifocal bilateral airspace disease greatest in the right upper and left lower lung zones. No effusion or pneumothorax. No acute bony abnormality. IMPRESSION: 1. Multifocal bilateral pneumonia. Electronically Signed   By: Randa Ngo M.D.   On: 12/28/2019 16:19   ECHOCARDIOGRAM LIMITED  Result Date: 12/31/2019    ECHOCARDIOGRAM REPORT   Patient Name:   SAMARION EHLE Date of Exam: 12/31/2019 Medical Rec #:  [id number]        Height:       71.0 in Accession #:    [id number]       Weight:       277.5 lb Date of Birth:  April 06, 1966        BSA:          2.423 m Patient Age:    78  years         BP:           119/67 mmHg Patient Gender: M                HR:           79 bpm. Exam Location:  Inpatient Procedure: 2D Echo, Intracardiac Opacification Agent, Cardiac Doppler and Color            Doppler Indications:    Atrial fibrillation 427.31/I48.91  History:        Patient has no prior history of Echocardiogram examinations.                 Risk Factors:Hypertension, Dyslipidemia and Sleep Apnea.                 Elevated troponin.  Sonographer:    Clayton Lefort RDCS (AE) Referring Phys: [id number] [facility] A SMITH  Sonographer Comments: Patient is morbidly obese and suboptimal apical window. Image acquisition challenging due to patient body habitus. IMPRESSIONS  1. Definity used; mild to moderate global reduction in LV systolic function; grade 2 diastolic dysfunction; mild LVE; mild LVH.  2. Left ventricular ejection fraction, by estimation,  is 40 to 45%. The left ventricle has mildly decreased function. The left ventricle demonstrates global hypokinesis. The left ventricular internal cavity size was mildly dilated. There is mild left ventricular hypertrophy. Left ventricular diastolic parameters are consistent with Grade II diastolic dysfunction (pseudonormalization).  3. Right ventricular systolic function is normal. The right ventricular size is normal.  4. The mitral valve is normal in structure. No evidence of mitral valve regurgitation. No evidence of mitral stenosis.  5. The aortic valve is tricuspid. Aortic valve regurgitation is not visualized. No aortic stenosis is present.  6. The inferior vena cava is normal in size with greater than 50% respiratory variability, suggesting right atrial pressure of 3 mmHg. FINDINGS  Left Ventricle: Left ventricular ejection fraction, by estimation, is 40 to 45%. The left ventricle has mildly decreased function. The left ventricle demonstrates global hypokinesis. Definity contrast agent was given IV to delineate the left ventricular  endocardial borders. The  left ventricular internal cavity size was mildly dilated. There is mild left ventricular hypertrophy. Left ventricular diastolic parameters are consistent with Grade II diastolic dysfunction (pseudonormalization). Right Ventricle: The right ventricular size is normal.Right ventricular systolic function is normal. Left Atrium: Left atrial size was normal in size. Right Atrium: Right atrial size was normal in size. Pericardium: There is no evidence of pericardial effusion. Mitral Valve: The mitral valve is normal in structure. Normal mobility of the mitral valve leaflets. No evidence of mitral valve regurgitation. No evidence of mitral valve stenosis. Tricuspid Valve: The tricuspid valve is normal in structure. Tricuspid valve regurgitation is trivial. No evidence of tricuspid stenosis. Aortic Valve: The aortic valve is tricuspid. Aortic valve regurgitation is not visualized. No aortic stenosis is present. Pulmonic Valve: The pulmonic valve was not well visualized. Pulmonic valve regurgitation is not visualized. No evidence of pulmonic stenosis. Aorta: The aortic root is normal in size and structure. Venous: The inferior vena cava is normal in size with greater than 50% respiratory variability, suggesting right atrial pressure of 3 mmHg. IAS/Shunts: No atrial level shunt detected by color flow Doppler. Additional Comments: Definity used; mild to moderate global reduction in LV systolic function; grade 2 diastolic dysfunction; mild LVE; mild LVH.  LEFT VENTRICLE PLAX 2D LVIDd:         5.66 cm      Diastology LVIDs:         4.68 cm      LV e' lateral:   9.03 cm/s LV PW:         1.20 cm      LV E/e' lateral: 9.6 LV IVS:        1.17 cm      LV e' medial:    7.83 cm/s LVOT diam:     2.20 cm      LV E/e' medial:  11.1 LV SV:         54 LV SV Index:   22 LVOT Area:     3.80 cm  LV Volumes (MOD) LV vol d, MOD A4C: 139.0 ml LV vol s, MOD A4C: 75.2 ml LV SV MOD A4C:     139.0 ml IVC IVC diam: 1.78 cm LEFT ATRIUM            Index LA Vol (A2C): 36.7 ml 15.14 ml/m LA Vol (A4C): 58.9 ml 24.30 ml/m  AORTIC VALVE LVOT Vmax:   78.30 cm/s LVOT Vmean:  51.900 cm/s LVOT VTI:    0.142 m  AORTA Ao Root diam: 3.60 cm Ao Asc diam:  3.30 cm MITRAL VALVE MV Area (PHT): 4.29 cm    SHUNTS MV Decel Time: 177 msec    Systemic VTI:  0.14 m MV E velocity: 87.00 cm/s  Systemic Diam: 2.20 cm MV A velocity: 62.10 cm/s MV E/A ratio:  1.40 Kirk Ruths MD Electronically signed by Kirk Ruths MD Signature Date/Time: 12/31/2019/2:30:12 PM    Final

## 2020-01-06 ENCOUNTER — Inpatient Hospital Stay (HOSPITAL_COMMUNITY): Payer: 59

## 2020-01-06 LAB — GLUCOSE, CAPILLARY
Glucose-Capillary: 105 mg/dL — ABNORMAL HIGH (ref 70–99)
Glucose-Capillary: 113 mg/dL — ABNORMAL HIGH (ref 70–99)
Glucose-Capillary: 120 mg/dL — ABNORMAL HIGH (ref 70–99)

## 2020-01-06 MED ORDER — POTASSIUM CHLORIDE CRYS ER 20 MEQ PO TBCR
30.0000 meq | EXTENDED_RELEASE_TABLET | Freq: Once | ORAL | Status: AC
  Administered 2020-01-06: 10:00:00 30 meq via ORAL
  Filled 2020-01-06: qty 1

## 2020-01-06 MED ORDER — LISINOPRIL 5 MG PO TABS: 5.0000 mg | ORAL_TABLET | Freq: Every day | ORAL | 0 refills | Status: AC

## 2020-01-06 MED ORDER — APIXABAN 5 MG PO TABS: 5.0000 mg | ORAL_TABLET | Freq: Two times a day (BID) | ORAL | 0 refills | Status: AC

## 2020-01-06 MED ORDER — METOPROLOL TARTRATE 50 MG PO TABS
50.0000 mg | ORAL_TABLET | Freq: Two times a day (BID) | ORAL | Status: DC
  Administered 2020-01-06: 50 mg via ORAL
  Filled 2020-01-06: qty 1

## 2020-01-06 MED ORDER — METOPROLOL TARTRATE 50 MG PO TABS: 50.0000 mg | ORAL_TABLET | Freq: Two times a day (BID) | ORAL | 0 refills | Status: AC

## 2020-01-06 MED ORDER — ALBUTEROL SULFATE HFA 108 (90 BASE) MCG/ACT IN AERS: 2.0000 | INHALATION_SPRAY | Freq: Four times a day (QID) | RESPIRATORY_TRACT | 0 refills | Status: AC | PRN

## 2020-01-06 MED FILL — LISINOPRIL 5 MG TABS: 5 | 30 days supply | Qty: 30 | Fill #0

## 2020-01-06 MED FILL — ALBUTEROL SULFATE HFA 108 (: 108 (90 BAS | 25 days supply | Qty: 18 | Fill #0

## 2020-01-06 MED FILL — ELIQUIS 5 MG TABLET: 5 | 30 days supply | Qty: 60 | Fill #0

## 2020-01-06 MED FILL — METOPROLOL TARTRATE 50 MG T: 50 | 30 days supply | Qty: 60 | Fill #0

## 2020-01-06 NOTE — TOC Transition Note (Signed)
Transition of Care [facility]) - CM/SW Discharge Note   Patient Details  Name: Jared Lopez MRN: [id number] Date of Birth: [birth year]  [facility]) CM/SW Contact:  Cherylann Parr, RN Phone Number: 01/06/2020, 10:07 AM   Clinical Narrative:   PTA independent from home with wife.  Pt confirms he has PCP and denied barriers with paying for meds.  Pt in agreement with home oxygen - pt chose [employer] - agency accepts referral/.  [employer] will provide POC to bedside prior to discharge - pt aware that agency will contact him post discharge for home delivery of oxygen equipment.  CM explained benefits of Eliquis free 30 and reduced copay card.  CM printed reduced copay card and asked nurse to provide to pt directly before he  Discharges.  Benefit check submitted .       Final next level of care: Home/Self Care Barriers to Discharge: Barriers Resolved   Patient Goals and CMS Choice Patient states their goals for this hospitalization and ongoing recovery are:: Pt states he is ready to perform his outdoor activites CMS Medicare.gov Compare Post Acute Care list provided to:: Patient Choice offered to / list presented to : Patient  Discharge Placement                       Discharge Plan and Services                DME Arranged: Oxygen DME Agency: [facility] Date DME Agency Contacted: 01/06/20 Time DME Agency Contacted: 1007 Representative spoke with at DME Agency: Zack            Social Determinants of Health (SDOH) Interventions     Readmission Risk Interventions No flowsheet data found.

## 2020-01-06 NOTE — Progress Notes (Signed)
Pt given discharge instructions, prescriptions from [facility] pharmacy, and care notes; and home O2 delivered to bedside. Pt verbalized understanding AEB no further questions or concerns at this time. IV was discontinued, no redness, pain, or swelling noted at this time. Telemetry discontinued and Centralized Telemetry was notified. Pt left the floor via wheelchair with staff in stable condition.

## 2020-01-06 NOTE — Progress Notes (Signed)
CRITICAL VALUE ALERT  Critical Value: PCxR showed Persistent subcutaneous emphysema in neck and possible pneumomediastinum  Date & Time Notied:  5/3 0944  Provider Notified: Text paged Dr. Thedore Mins and notified RN  Orders Received/Actions taken: Returned call from Dr. Thedore Mins, no new orders.  Ok for pt. To be d/c'd

## 2020-01-06 NOTE — Progress Notes (Signed)
SATURATION QUALIFICATIONS: (This note is used to comply with regulatory documentation for home oxygen)  Patient Saturations on Room Air at Rest = 93%  Patient Saturations on Room Air while Ambulating = 87%  Patient Saturations on 2 Liters of oxygen while Ambulating = 90%  Please briefly explain why patient needs home oxygen: Pt needs home O2 in order to maintain sats above 88% while ambulating

## 2020-01-06 NOTE — TOC Benefit Eligibility Note (Signed)
Transition of Care [facility]) Benefit Eligibility Note    Patient Details  Name: Jared Lopez MRN: [id number] Date of Birth: 01/29/1966   Medication/Dose: Eliquis  Covered?: Yes     Prescription Coverage Preferred Pharmacy: most major retail pharmacies  Spoke with Person/Company/Phone Number:: [employer]  Co-Pay: $20.00 for 30 day retail  Prior Approval: No          Orson Aloe Phone Number: 01/06/2020, 10:40 AM

## 2020-01-06 NOTE — Discharge Summary (Signed)
Jared Lopez GGY:[id number] DOB: 05-02-1966 DOA: 12/30/2019  PCP: Iona Beard, MD  Admit date: 12/30/2019  Discharge date: 01/06/2020  Admitted From: Home  Disposition:  Home   Recommendations for Outpatient Follow-up:   Follow up with PCP in 1-2 weeks  PCP Please obtain BMP/CBC, 2 view CXR in 1week,  (see Discharge instructions)   PCP Please follow up on the following pending results: Check CBC, CMP and a two-view chest x-ray in 7 to 10 days.  Needs outpatient cardiology follow-up.  Monitor the need for diuretics.   Home Health: None Equipment/Devices: 2 L nasal cannula oxygen Consultations: Cards Dr Einar Gip over the phone Discharge Condition: Stable    CODE STATUS: Full    Diet Recommendation: Heart Healthy with 1.5 L fluid restriction per day    Chief Complaint  Patient presents with  . Atrial Fibrillation  . Covid Exposure     Brief history of present illness from the day of admission and additional interim summary    Jared Liddy Johnsonis a 54 y.o.malewith medical history significant ofhypertension, hyperlipidemia, obesity, BPH, and OSA on CPAP presents with complaints of shortness of breath. He had not been feeling well over the last 1-2weeks with reports of a dry cough, in the ER he was diagnosed with COVID-19 pneumonia along with A. fib RVR and admitted to the hospital.                                                                 Hospital Course    1. Acute Covid 19 Viral Pneumonitis during the ongoing 2020 Covid 19 Pandemic - he had severe disease and received IV steroids, remdesivir along with Actemra appropriately, his course was complicated by A. fib RVR and slight decompensation of his acute on chronic combined systolic and diastolic heart failure.  He also developed small  pneumomediastinum but with no symptoms.  With supportive care he is back to his baseline in terms of his symptoms, i.e. he is symptom-free on 2 L nasal cannula oxygen eager to go home, he will be discharged home on 2 L nasal cannula oxygen, rescue inhaler with outpatient follow-up with PCP in 7 to 10 days.  Request PCP to check CBC, CMP and a two-view chest x-ray in 7 to 10 days on his follow-up visit.  He has also been requested to follow with his primary cardiologist Dr. Einar Gip within a week to ten days.   Recent Labs  Lab 12/30/19 1040 12/30/19 1645 12/31/19 0357 01/01/20 0525 01/02/20 0409 01/03/20 0416 01/04/20 0444  CRP  --   --  12.9* 8.0* 5.1* 2.3* 1.1*  DDIMER   < > 0.60* 0.95* 0.74* 0.78* 0.71* 0.75*  FERRITIN  --  696* 682*  --   --   --   --   BNP   < >  --  56.1 36.9 26.3 33.2 44.7  PROCALCITON  --  <0.10 <0.10 <0.10 <0.10  --   --    < > = values in this interval not displayed.    Hepatic Function Latest Ref Rng & Units 01/04/2020 01/03/2020 01/02/2020  Total Protein 6.5 - 8.1 g/dL 6.6 6.8 7.0  Albumin 3.5 - 5.0 g/dL 2.7(L) 2.8(L) 2.7(L)  AST 15 - 41 U/L 27 42(H) 44(H)  ALT 0 - 44 U/L 71(H) 79(H) 66(H)  Alk Phosphatase 38 - 126 U/L 58 56 55  Total Bilirubin 0.3 - 1.2 mg/dL 0.9 0.9 0.8    2.  Paroxysmal A. fib versus new onset A. fib RVR.  Mali vas 2 score of  2 -   initially on Cardizem drip now on oral Lopressor and Eliquis combination, case discussed with his primary cardiologist Dr. Einar Gip who will follow the patient in the office post discharge.   3.  Mild COVID-19 viral infection related transaminitis.  Asymptomatic with stable trend.  Request PCP to recheck CMP in 7 to 10 days.    4. Obesity.  BMI 38.  Follow with PCP.  5.  BPH.  On Flomax.  6.  OSA.  CPAP at night.  7.  Mild acute on chronic systolic and diastolic CHF EF around 68%.  Continue beta-blocker, placed on low-dose ACE inhibitor.  Outpatient follow-up with his primary cardiologist Dr. Einar Gip post  discharge.  Initially required some diuretics for trace edema but now resolved and compensated.  PCP to monitor the need of diuretics in the future.  Monitor BMP as is being placed on ACE inhibitor.   Discharge diagnosis     Principal Problem:   Pneumonia due to COVID-19 virus Active Problems:   Atrial fibrillation with RVR (HCC)   Acute respiratory failure with hypoxia (HCC)   Elevated troponin   Elevated liver enzymes   Swelling of lower extremity   Obesity (BMI 30-39.9)   Essential hypertension    Discharge instructions    Discharge Instructions    Discharge instructions   Complete by: As directed    Follow with Primary MD Iona Beard, MD in 7 days   Get CBC, CMP, 2 view Chest X ray -  checked next visit within 1 week by Primary MD   Activity: As tolerated with Full fall precautions use walker/cane & assistance as needed  Disposition Home   Diet: Heart Healthy with 1.5 L fluid restriction per day  Check your Weight same time everyday, if you gain over 2 pounds, or you develop in leg swelling, experience more shortness of breath or chest pain, call your Primary MD immediately. Follow Cardiac Low Salt Diet and 1.5 lit/day fluid restriction.  Special Instructions: If you have smoked or chewed Tobacco  in the last 2 yrs please stop smoking, stop any regular Alcohol  and or any Recreational drug use.  On your next visit with your primary care physician please Get Medicines reviewed and adjusted.  Please request your Prim.MD to go over all Hospital Tests and Procedure/Radiological results at the follow up, please get all Hospital records sent to your Prim MD by signing hospital release before you go home.  If you experience worsening of your admission symptoms, develop shortness of breath, life threatening emergency, suicidal or homicidal thoughts you must seek medical attention immediately by calling 911 or calling your MD immediately  if symptoms less severe.  You Must  read complete instructions/literature along with all the possible adverse reactions/side effects for all the  Medicines you take and that have been prescribed to you. Take any new Medicines after you have completely understood and accpet all the possible adverse reactions/side effects.   Increase activity slowly   Complete by: As directed    MyChart COVID-19 home monitoring program   Complete by: Jan 06, 2020    Is the patient willing to use the [facility] for home monitoring?: Yes   Temperature monitoring   Complete by: Jan 06, 2020    After how many days would you like to receive a notification of this patient's flowsheet entries?: 1      Discharge Medications   Allergies as of 01/06/2020   No Known Allergies     Medication List    STOP taking these medications   HYDROcodone-acetaminophen 5-325 MG tablet Commonly known as: Norco   isosorbide mononitrate 20 MG tablet Commonly known as: ISMO   verapamil 240 MG CR tablet Commonly known as: CALAN-SR     TAKE these medications   albuterol 108 (90 Base) MCG/ACT inhaler Commonly known as: VENTOLIN HFA Inhale 2 puffs into the lungs every 6 (six) hours as needed for wheezing or shortness of breath.   apixaban 5 MG Tabs tablet Commonly known as: ELIQUIS Take 1 tablet (5 mg total) by mouth 2 (two) times daily.   dicyclomine 20 MG tablet Commonly known as: BENTYL Take 20 mg by mouth See admin instructions. 21m once daily 30 minutes before breakfast and then 256mat bedtime for spasmic colon.   lisinopril 5 MG tablet Commonly known as: ZESTRIL Take 1 tablet (5 mg total) by mouth daily.   metoprolol tartrate 50 MG tablet Commonly known as: LOPRESSOR Take 1 tablet (50 mg total) by mouth 2 (two) times daily.   tamsulosin 0.4 MG Caps capsule Commonly known as: FLOMAX Take 0.4 mg by mouth daily after supper.            Durable Medical Equipment  (From admission, onward)         Start     Ordered   01/06/20  0953  For home use only DME oxygen  Once    Question Answer Comment  Length of Need 6 Months   Mode or (Route) Nasal cannula   Liters per Minute 2   Frequency Continuous (stationary and portable oxygen unit needed)   Oxygen conserving device Yes   Oxygen delivery system Gas      01/06/20 0952          Follow-up Information    HiIona BeardMD. Schedule an appointment as soon as possible for a visit in 1 week(s).   Specialty: Family Medicine Contact information: [address] [place] [phone]        GaAdrian ProwsMD. Schedule an appointment as soon as possible for a visit in 1 week(s).   Specialty: Cardiology Contact information: [address] [phone]         Major procedures and Radiology Reports - PLEASE review detailed and final reports thoroughly  -        DG Chest Port 1 View  Result Date: 01/06/2020 CLINICAL DATA:  Shortness of breath EXAM: PORTABLE CHEST 1 VIEW COMPARISON:  01/04/2019 FINDINGS: Very low lung volumes. Persistent bilateral opacities similar to the prior study. No large pleural effusion. No definite pneumothorax. There is subcutaneous emphysema in the neck and possible pneumomediastinum. Similar cardiomediastinal contours. IMPRESSION: Persistent subcutaneous emphysema in neck and  possible pneumomediastinum. Low lung volumes with persistent bilateral opacities similar to the prior study. These results will be called to the ordering clinician or representative by the Radiologist Assistant, and communication documented in the PACS or [employer]. Electronically Signed   By: Macy Mis M.D.   On: 01/06/2020 09:29   DG Chest Port 1 View  Result Date: 01/04/2020 CLINICAL DATA:  Short of breath. Patient is covid postitive EXAM: PORTABLE CHEST 1 VIEW COMPARISON:  Chest radiograph 01/01/2020 FINDINGS: Stable cardiomediastinal contours. Low lung volumes. Aeration of the lungs appears slightly  improved bilaterally particularly at the left base. No evidence of pneumothorax. No significant pleural effusion. There are some subtle linear lucencies at the neck base, partially visualized on the current radiograph which may be artifactual. IMPRESSION: 1. Slightly improved aeration particularly at the left base. 2. Subtle linear lucencies at the neck base may be artifactual. Consider repeat radiograph with a wider field-of-view to exclude subcutaneous emphysema. Electronically Signed   By: Audie Pinto M.D.   On: 01/04/2020 16:19   DG Chest Port 1 View  Result Date: 01/01/2020 CLINICAL DATA:  Shortness of breath, COVID positive EXAM: PORTABLE CHEST 1 VIEW COMPARISON:  12/30/2019 FINDINGS: Low lung volumes. Cardiomegaly. Patchy bilateral airspace disease worsening since prior study. No effusions or pneumothorax. No acute bony abnormality. IMPRESSION: Low lung volumes with worsening patchy bilateral airspace disease/pneumonia. Electronically Signed   By: Rolm Baptise M.D.   On: 01/01/2020 08:56   DG Chest Port 1 View  Result Date: 12/30/2019 CLINICAL DATA:  Shortness of breath, recent COVID positive EXAM: PORTABLE CHEST 1 VIEW COMPARISON:  12/28/2019 FINDINGS: Low lung volumes. Persistent bilateral opacities, greatest at the left lung base. No significant pleural effusion. No pneumothorax. Stable cardiomediastinal contours. IMPRESSION: Similar appearance of bilateral opacities likely reflecting pneumonia. Electronically Signed   By: Macy Mis M.D.   On: 12/30/2019 11:11   DG Chest Portable 1 View  Result Date: 12/28/2019 CLINICAL DATA:  Congestion, cough, fever EXAM: PORTABLE CHEST 1 VIEW COMPARISON:  01/31/2018 FINDINGS: Single frontal view of the chest demonstrates a stable cardiac silhouette. Interval development of multifocal bilateral airspace disease greatest in the right upper and left lower lung zones. No effusion or pneumothorax. No acute bony abnormality. IMPRESSION: 1. Multifocal  bilateral pneumonia. Electronically Signed   By: Randa Ngo M.D.   On: 12/28/2019 16:19   ECHOCARDIOGRAM LIMITED  Result Date: 12/31/2019    ECHOCARDIOGRAM REPORT   Patient Name:   Jared Lopez Date of Exam: 12/31/2019 Medical Rec #:  [id number]        Height:       71.0 in Accession #:    [id number]       Weight:       277.5 lb Date of Birth:  Nov 04, 1965        BSA:          2.423 m Patient Age:    81 years         BP:           119/67 mmHg Patient Gender: M                HR:           79 bpm. Exam Location:  Inpatient Procedure: 2D Echo, Intracardiac Opacification Agent, Cardiac Doppler and Color            Doppler Indications:    Atrial fibrillation 427.31/I48.91  History:        Patient has  no prior history of Echocardiogram examinations.                 Risk Factors:Hypertension, Dyslipidemia and Sleep Apnea.                 Elevated troponin.  Sonographer:    Clayton Lefort RDCS (AE) Referring Phys: [id number] [facility] A SMITH  Sonographer Comments: Patient is morbidly obese and suboptimal apical window. Image acquisition challenging due to patient body habitus. IMPRESSIONS  1. Definity used; mild to moderate global reduction in LV systolic function; grade 2 diastolic dysfunction; mild LVE; mild LVH.  2. Left ventricular ejection fraction, by estimation, is 40 to 45%. The left ventricle has mildly decreased function. The left ventricle demonstrates global hypokinesis. The left ventricular internal cavity size was mildly dilated. There is mild left ventricular hypertrophy. Left ventricular diastolic parameters are consistent with Grade II diastolic dysfunction (pseudonormalization).  3. Right ventricular systolic function is normal. The right ventricular size is normal.  4. The mitral valve is normal in structure. No evidence of mitral valve regurgitation. No evidence of mitral stenosis.  5. The aortic valve is tricuspid. Aortic valve regurgitation is not visualized. No aortic stenosis is present.  6. The  inferior vena cava is normal in size with greater than 50% respiratory variability, suggesting right atrial pressure of 3 mmHg. FINDINGS  Left Ventricle: Left ventricular ejection fraction, by estimation, is 40 to 45%. The left ventricle has mildly decreased function. The left ventricle demonstrates global hypokinesis. Definity contrast agent was given IV to delineate the left ventricular  endocardial borders. The left ventricular internal cavity size was mildly dilated. There is mild left ventricular hypertrophy. Left ventricular diastolic parameters are consistent with Grade II diastolic dysfunction (pseudonormalization). Right Ventricle: The right ventricular size is normal.Right ventricular systolic function is normal. Left Atrium: Left atrial size was normal in size. Right Atrium: Right atrial size was normal in size. Pericardium: There is no evidence of pericardial effusion. Mitral Valve: The mitral valve is normal in structure. Normal mobility of the mitral valve leaflets. No evidence of mitral valve regurgitation. No evidence of mitral valve stenosis. Tricuspid Valve: The tricuspid valve is normal in structure. Tricuspid valve regurgitation is trivial. No evidence of tricuspid stenosis. Aortic Valve: The aortic valve is tricuspid. Aortic valve regurgitation is not visualized. No aortic stenosis is present. Pulmonic Valve: The pulmonic valve was not well visualized. Pulmonic valve regurgitation is not visualized. No evidence of pulmonic stenosis. Aorta: The aortic root is normal in size and structure. Venous: The inferior vena cava is normal in size with greater than 50% respiratory variability, suggesting right atrial pressure of 3 mmHg. IAS/Shunts: No atrial level shunt detected by color flow Doppler. Additional Comments: Definity used; mild to moderate global reduction in LV systolic function; grade 2 diastolic dysfunction; mild LVE; mild LVH.  LEFT VENTRICLE PLAX 2D LVIDd:         5.66 cm      Diastology  LVIDs:         4.68 cm      LV e' lateral:   9.03 cm/s LV PW:         1.20 cm      LV E/e' lateral: 9.6 LV IVS:        1.17 cm      LV e' medial:    7.83 cm/s LVOT diam:     2.20 cm      LV E/e' medial:  11.1 LV SV:  54 LV SV Index:   22 LVOT Area:     3.80 cm  LV Volumes (MOD) LV vol d, MOD A4C: 139.0 ml LV vol s, MOD A4C: 75.2 ml LV SV MOD A4C:     139.0 ml IVC IVC diam: 1.78 cm LEFT ATRIUM           Index LA Vol (A2C): 36.7 ml 15.14 ml/m LA Vol (A4C): 58.9 ml 24.30 ml/m  AORTIC VALVE LVOT Vmax:   78.30 cm/s LVOT Vmean:  51.900 cm/s LVOT VTI:    0.142 m  AORTA Ao Root diam: 3.60 cm Ao Asc diam:  3.30 cm MITRAL VALVE MV Area (PHT): 4.29 cm    SHUNTS MV Decel Time: 177 msec    Systemic VTI:  0.14 m MV E velocity: 87.00 cm/s  Systemic Diam: 2.20 cm MV A velocity: 62.10 cm/s MV E/A ratio:  1.40 Kirk Ruths MD Electronically signed by Kirk Ruths MD Signature Date/Time: 12/31/2019/2:30:12 PM    Final     Micro Results     Recent Results (from the past 240 hour(s))  SARS Coronavirus 2 Ag (30 min TAT) - Nasal Swab (BD Veritor Kit)     Status: Abnormal   Collection Time: 12/28/19  3:33 PM   Specimen: Nasal Swab (BD Veritor Kit)  Result Value Ref Range Status   SARS Coronavirus 2 Ag POSITIVE (A) NEGATIVE Final    Comment: RESULT CALLED TO, READ BACK BY AND VERIFIED WITH: PEGRAM JERI RN [phone] [id number] PHILLIPS C (NOTE) SARS-CoV-2 antigen PRESENT. Positive results indicate the presence of viral antigens, but clinical correlation with patient history and other diagnostic information is necessary to determine patient infection status.  Positive results do not rule out bacterial infection or co-infection  with other viruses. False positive results are rare but can occur, and confirmatory RT-PCR testing may be appropriate in some circumstances. The expected result is Negative. Fact Sheet for Patients: [employer]Park.tn Fact Sheet for Providers:  GiftContent.is  This test is not yet approved or cleared by the [place] FDA and  has been authorized for detection and/or diagnosis of SARS-CoV-2 by FDA under an Emergency Use Authorization (EUA).  This EUA will remain in effect (meaning this test can be used) for the duration of  the COVID-19 decla ration under Section 564(b)(1) of the Act, 21 U.S.C. section 360bbb-3(b)(1), unless the authorization is terminated or revoked sooner. Performed at [facility], [address], [place], [place] [address]     Today   Subjective    Quy Lotts today has no headache,no chest abdominal pain,no new weakness tingling or numbness, feels much better wants to go home today.     Objective   Blood pressure 112/79, pulse 87, temperature 97.8 F (36.6 C), temperature source Oral, resp. rate (!) 24, height 5' 10.98" (1.803 m), weight 125.9 kg, SpO2 91%   Intake/Output Summary (Last 24 hours) at 01/06/2020 0955 Last data filed at 01/06/2020 0834 Gross per 24 hour  Intake 600 ml  Output 1500 ml  Net -900 ml    Exam  Awake Alert, Oriented x 3, No new F.N deficits, Normal affect .AT,PERRAL Supple Neck,No JVD, No cervical lymphadenopathy appriciated.  Symmetrical Chest wall movement, Good air movement bilaterally, CTAB RRR,No Gallops,Rubs or new Murmurs, No Parasternal Heave +ve B.Sounds, Abd Soft, Non tender, No organomegaly appriciated, No rebound -guarding or rigidity. No Cyanosis, Clubbing or edema, No new Rash or bruise   Data Review   CBC w Diff:  Lab  Results  Component Value Date   WBC 16.7 (H) 01/04/2020   HGB 15.4 01/04/2020   HCT 47.1 01/04/2020   PLT 387 01/04/2020   LYMPHOPCT 9 01/04/2020   BANDSPCT 1 07/25/2016   MONOPCT 8 01/04/2020   EOSPCT 0 01/04/2020   BASOPCT 0 01/04/2020    CMP:  Lab Results  Component Value Date   NA 140 01/05/2020   K 3.5 01/05/2020   CL 98 01/05/2020   CO2 30 01/05/2020   BUN 21 (H)  01/05/2020   CREATININE 1.17 01/05/2020   PROT 6.6 01/04/2020   ALBUMIN 2.7 (L) 01/04/2020   BILITOT 0.9 01/04/2020   ALKPHOS 58 01/04/2020   AST 27 01/04/2020   ALT 71 (H) 01/04/2020  .   Total Time in preparing paper work, data evaluation and todays exam - 33 minutes  Lala Lund M.D on 01/06/2020 at 9:55 AM  [facility]   Office  [phone]

## 2020-01-06 NOTE — Discharge Instructions (Signed)
Follow with Primary MD Iona Beard, MD in 7 days   Get CBC, CMP, 2 view Chest X ray -  checked next visit within 1 week by Primary MD   Activity: As tolerated with Full fall precautions use walker/cane & assistance as needed  Disposition Home   Diet: Heart Healthy with 1.5 L fluid restriction per day  Check your Weight same time everyday, if you gain over 2 pounds, or you develop in leg swelling, experience more shortness of breath or chest pain, call your Primary MD immediately. Follow Cardiac Low Salt Diet and 1.5 lit/day fluid restriction.  Special Instructions: If you have smoked or chewed Tobacco  in the last 2 yrs please stop smoking, stop any regular Alcohol  and or any Recreational drug use.  On your next visit with your primary care physician please Get Medicines reviewed and adjusted.  Please request your Prim.MD to go over all Hospital Tests and Procedure/Radiological results at the follow up, please get all Hospital records sent to your Prim MD by signing hospital release before you go home.  If you experience worsening of your admission symptoms, develop shortness of breath, life threatening emergency, suicidal or homicidal thoughts you must seek medical attention immediately by calling 911 or calling your MD immediately  if symptoms less severe.  You Must read complete instructions/literature along with all the possible adverse reactions/side effects for all the Medicines you take and that have been prescribed to you. Take any new Medicines after you have completely understood and accpet all the possible adverse reactions/side effects.       Person Under Monitoring Name: Jared Lopez  Location: [place] [address]   Infection Prevention Recommendations for Individuals Confirmed to have, or Being Evaluated for, 2019 Novel Coronavirus (COVID-19) Infection Who Receive Care at Home  Individuals who are confirmed to have, or are being evaluated for,  COVID-19 should follow the prevention steps below until a healthcare provider or local or state health department says they can return to normal activities.  Stay home except to get medical care You should restrict activities outside your home, except for getting medical care. Do not go to work, school, or public areas, and do not use public transportation or taxis.  Call ahead before visiting your doctor Before your medical appointment, call the healthcare provider and tell them that you have, or are being evaluated for, COVID-19 infection. This will help the healthcare provider's office take steps to keep other people from getting infected. Ask your healthcare provider to call the local or state health department.  Monitor your symptoms Seek prompt medical attention if your illness is worsening (e.g., difficulty breathing). Before going to your medical appointment, call the healthcare provider and tell them that you have, or are being evaluated for, COVID-19 infection. Ask your healthcare provider to call the local or state health department.  Wear a facemask You should wear a facemask that covers your nose and mouth when you are in the same room with other people and when you visit a healthcare provider. People who live with or visit you should also wear a facemask while they are in the same room with you.  Separate yourself from other people in your home As much as possible, you should stay in a different room from other people in your home. Also, you should use a separate bathroom, if available.  Avoid sharing household items You should not share dishes, drinking glasses, cups, eating utensils, towels, bedding,  or other items with other people in your home. After using these items, you should wash them thoroughly with soap and water.  Cover your coughs and sneezes Cover your mouth and nose with a tissue when you cough or sneeze, or you can cough or sneeze into your sleeve.  Throw used tissues in a lined trash can, and immediately wash your hands with soap and water for at least 20 seconds or use an alcohol-based hand rub.  Wash your [employer] your hands often and thoroughly with soap and water for at least 20 seconds. You can use an alcohol-based hand sanitizer if soap and water are not available and if your hands are not visibly dirty. Avoid touching your eyes, nose, and mouth with unwashed hands.   Prevention Steps for Caregivers and Household Members of Individuals Confirmed to have, or Being Evaluated for, COVID-19 Infection Being Cared for in the Home  If you live with, or provide care at home for, a person confirmed to have, or being evaluated for, COVID-19 infection please follow these guidelines to prevent infection:  Follow healthcare provider's instructions Make sure that you understand and can help the patient follow any healthcare provider instructions for all care.  Provide for the patient's basic needs You should help the patient with basic needs in the home and provide support for getting groceries, prescriptions, and other personal needs.  Monitor the patient's symptoms If they are getting sicker, call his or her medical provider and tell them that the patient has, or is being evaluated for, COVID-19 infection. This will help the healthcare provider's office take steps to keep other people from getting infected. Ask the healthcare provider to call the local or state health department.  Limit the number of people who have contact with the patient  If possible, have only one caregiver for the patient.  Other household members should stay in another home or place of residence. If this is not possible, they should stay  in another room, or be separated from the patient as much as possible. Use a separate bathroom, if available.  Restrict visitors who do not have an essential need to be in the home.  Keep older adults, very young  children, and other sick people away from the patient Keep older adults, very young children, and those who have compromised immune systems or chronic health conditions away from the patient. This includes people with chronic heart, lung, or kidney conditions, diabetes, and cancer.  Ensure good ventilation Make sure that shared spaces in the home have good air flow, such as from an air conditioner or an opened window, weather permitting.  Wash your hands often  Wash your hands often and thoroughly with soap and water for at least 20 seconds. You can use an alcohol based hand sanitizer if soap and water are not available and if your hands are not visibly dirty.  Avoid touching your eyes, nose, and mouth with unwashed hands.  Use disposable paper towels to dry your hands. If not available, use dedicated cloth towels and replace them when they become wet.  Wear a facemask and gloves  Wear a disposable facemask at all times in the room and gloves when you touch or have contact with the patient's blood, body fluids, and/or secretions or excretions, such as sweat, saliva, sputum, nasal mucus, vomit, urine, or feces.  Ensure the mask fits over your nose and mouth tightly, and do not touch it during use.  Throw out disposable facemasks and gloves  after using them. Do not reuse.  Wash your hands immediately after removing your facemask and gloves.  If your personal clothing becomes contaminated, carefully remove clothing and launder. Wash your hands after handling contaminated clothing.  Place all used disposable facemasks, gloves, and other waste in a lined container before disposing them with other household waste.  Remove gloves and wash your hands immediately after handling these items.  Do not share dishes, glasses, or other household items with the patient  Avoid sharing household items. You should not share dishes, drinking glasses, cups, eating utensils, towels, bedding, or other items  with a patient who is confirmed to have, or being evaluated for, COVID-19 infection.  After the person uses these items, you should wash them thoroughly with soap and water.  Wash laundry thoroughly  Immediately remove and wash clothes or bedding that have blood, body fluids, and/or secretions or excretions, such as sweat, saliva, sputum, nasal mucus, vomit, urine, or feces, on them.  Wear gloves when handling laundry from the patient.  Read and follow directions on labels of laundry or clothing items and detergent. In general, wash and dry with the warmest temperatures recommended on the label.  Clean all areas the individual has used often  Clean all touchable surfaces, such as counters, tabletops, doorknobs, bathroom fixtures, toilets, phones, keyboards, tablets, and bedside tables, every day. Also, clean any surfaces that may have blood, body fluids, and/or secretions or excretions on them.  Wear gloves when cleaning surfaces the patient has come in contact with.  Use a diluted bleach solution (e.g., dilute bleach with 1 part bleach and 10 parts water) or a household disinfectant with a label that says EPA-registered for coronaviruses. To make a bleach solution at home, add 1 tablespoon of bleach to 1 quart (4 cups) of water. For a larger supply, add  cup of bleach to 1 gallon (16 cups) of water.  Read labels of cleaning products and follow recommendations provided on product labels. Labels contain instructions for safe and effective use of the cleaning product including precautions you should take when applying the product, such as wearing gloves or eye protection and making sure you have good ventilation during use of the product.  Remove gloves and wash hands immediately after cleaning.  Monitor yourself for signs and symptoms of illness Caregivers and household members are considered close contacts, should monitor their health, and will be asked to limit movement outside of the  home to the extent possible. Follow the monitoring steps for close contacts listed on the symptom monitoring form.   ? If you have additional questions, contact your local health department or call the epidemiologist on call at [phone] (available 24/7). ? This guidance is subject to change. For the most up-to-date guidance from [facility], please refer to their website: TripMetro.hu   Information on my medicine - ELIQUIS (apixaban)  Why was Eliquis prescribed for you? Eliquis was prescribed for you to reduce the risk of a blood clot forming that can cause a stroke if you have a medical condition called atrial fibrillation (a type of irregular heartbeat).  What do You need to know about Eliquis ? Take your Eliquis TWICE DAILY - one tablet in the morning and one tablet in the evening with or without food. If you have difficulty swallowing the tablet whole please discuss with your pharmacist how to take the medication safely.  Take Eliquis exactly as prescribed by your doctor and DO NOT stop taking Eliquis without talking to the doctor  who prescribed the medication.  Stopping may increase your risk of developing a stroke.  Refill your prescription before you run out.  After discharge, you should have regular check-up appointments with your healthcare provider that is prescribing your Eliquis.  In the future your dose may need to be changed if your kidney function or weight changes by a significant amount or as you get older.  What do you do if you miss a dose? If you miss a dose, take it as soon as you remember on the same day and resume taking twice daily.  Do not take more than one dose of ELIQUIS at the same time to make up a missed dose.  Important Safety Information A possible side effect of Eliquis is bleeding. You should call your healthcare provider right away if you experience any of the following: ? Bleeding from  an injury or your nose that does not stop. ? Unusual colored urine (red or dark brown) or unusual colored stools (red or black). ? Unusual bruising for unknown reasons. ? A serious fall or if you hit your head (even if there is no bleeding).  Some medicines may interact with Eliquis and might increase your risk of bleeding or clotting while on Eliquis. To help avoid this, consult your healthcare provider or pharmacist prior to using any new prescription or non-prescription medications, including herbals, vitamins, non-steroidal anti-inflammatory drugs (NSAIDs) and supplements.  This website has more information on Eliquis (apixaban): http://www.eliquis.com/eliquis/home

## 2020-05-26 ENCOUNTER — Other Ambulatory Visit: Payer: Self-pay | Admitting: *Deleted

## 2020-05-26 ENCOUNTER — Telehealth: Payer: Self-pay | Admitting: *Deleted

## 2020-05-26 DIAGNOSIS — R002 Palpitations: Secondary | ICD-10-CM

## 2020-05-26 NOTE — Telephone Encounter (Signed)
Patient enrolled for [employer] to ship a 3 day ZIO XT long term holter monitor to his home.  Instructions reviewed briefly as they are included in the monitor kit.

## 2020-06-01 ENCOUNTER — Ambulatory Visit (INDEPENDENT_AMBULATORY_CARE_PROVIDER_SITE_OTHER): Payer: 59

## 2020-06-01 DIAGNOSIS — R002 Palpitations: Secondary | ICD-10-CM

## 2020-09-03 ENCOUNTER — Telehealth: Payer: Self-pay

## 2020-09-03 NOTE — Telephone Encounter (Signed)
Error

## 2020-09-09 ENCOUNTER — Telehealth: Payer: Self-pay

## 2020-09-09 NOTE — Telephone Encounter (Signed)
NOTES ON FILE FROM DR Earvin Hansen [place]ILL [phone], SENT REFERRAL TO SCHEDULING

## 2020-09-21 NOTE — Progress Notes (Deleted)
Cardiology Office Note:    Date:  09/21/2020   ID:  Jared Lopez, DOB 1966-06-10, MRN [id number]  PCP:  Mirna Mires, MD  [facility] Cardiologist:  No primary care provider on file.  [facility] Electrophysiologist:  None   Referring MD: Mirna Mires, MD    History of Present Illness:    Jared Lopez is a 55 y.o. male with a hx of HTN, HLD, obesity, OSA on CPAP, Afib on eliquis, and chronic systolic HF with LVEF 40-45% who was referred by Dr. Loleta Chance for further management of his systolic heart failure and Afib.   Patient previously followed by Dr. Jacinto Halim. Had cath in 2013 without obstructive disease. TTE 2021 with LVEF 40-45% with global hypokinesis, mild LVH, GIIDD. Cardiac montior 06/11/20 without significant arrhythmias.   Past Medical History:  Diagnosis Date  . Abdominal pain   . Arthritis   . Blood in stool   . Blurred vision   . Chronic back pain   . Constipation   . Difficulty urinating   . Enlarged prostate   . Generalized headaches    high blood pressure onset  . Hernia   . Hyperlipidemia   . Hypertension   . Mass of perirectal soft tissue   . Over weight   . Rectal bleeding   . Sleep apnea    uses CPAP nightly    Past Surgical History:  Procedure Laterality Date  . BACK SURGERY    . CARDIAC CATHETERIZATION  2013   Dr Jacinto Halim  . HERNIA REPAIR    . KNEE ARTHROSCOPY WITH MEDIAL MENISECTOMY Left 11/23/2016   Procedure: KNEE ARTHROSCOPY WITH MEDIAL AND LATERAL MENISECTOMY AND DEBRIEDMENT OF CONDROMALACIA  FEMUR;  Surgeon: Gean Birchwood, MD;  Location: [facility];  Service: Orthopedics;  Laterality: Left;  . LEFT HEART CATHETERIZATION WITH CORONARY ANGIOGRAM N/A 06/12/2012   Procedure: LEFT HEART CATHETERIZATION WITH CORONARY ANGIOGRAM;  Surgeon: Pamella Pert, MD;  Location: [facility] CATH LAB;  Service: Cardiovascular;  Laterality: N/A;    Current Medications: No outpatient medications have been marked as taking for the 10/01/20 encounter  (Appointment) with Meriam Sprague, MD.     Allergies:   Patient has no known allergies.   Social History   Socioeconomic History  . Marital status: Married    Spouse name: Not on file  . Number of children: Not on file  . Years of education: Not on file  . Highest education level: Not on file  Occupational History  . Not on file  Tobacco Use  . Smoking status: Current Some Day Smoker    Types: Cigars  . Smokeless tobacco: Never Used  Vaping Use  . Vaping Use: Never used  Substance and Sexual Activity  . Alcohol use: Yes    Alcohol/week: 1.0 - 2.0 standard drink    Types: 1 - 2 Cans of beer per week    Comment: social  . Drug use: No  . Sexual activity: Not on file  Other Topics Concern  . Not on file  Social History Narrative  . Not on file   Social Determinants of Health   Financial Resource Strain: Not on file  Food Insecurity: Not on file  Transportation Needs: Not on file  Physical Activity: Not on file  Stress: Not on file  Social Connections: Not on file     Family History: The patient's ***family history includes Atrial fibrillation in his mother; Heart disease in his mother; Hypertension in his mother.  ROS:   Please see the history of present illness.    *** All other systems reviewed and are negative.  EKGs/Labs/Other Studies Reviewed:    The following studies were reviewed today: Cath 2013: Hemodynamic data:  Left ventricular pressure was 111/4 with LVEDP of 12 mm mercury. Aortic pressure was 105/75 with a mean of 89 mm mercury. There was no pressure gradient across the aortic valve  Left ventricle: Performed in the RAO projection revealed LVEF of 60%. There was No MR. No Wall motion abnormality.  Right coronary artery: The vessel is smooth, normal,  Dominant. It is a very large caliber vessel. The PD and PL bifurcation is in the proximal segment.  Left main coronary artery is large and normal.  Circumflex coronary artery: A large  vessel giving origin to a large obtuse marginal 1.   LAD:  Very large caliber vessel . LAD gives origin to a large diagonal-1.  LAD Normal  Cardiac Monitor 06/11/2020:  Normal sinus rhythm with rare PACs and PVCs.  No atrial fibrillation.  Rare short runs of PVCs (max 4 beats).  Rare short runs of SVT with all episodes less than 10 seconds; none of which correlated to patient symptoms.  No sustained arrhythmias.  TTE 12/31/19: IMPRESSIONS  1. Definity used; mild to moderate global reduction in LV systolic  function; grade 2 diastolic dysfunction; mild LVE; mild LVH.  2. Left ventricular ejection fraction, by estimation, is 40 to 45%. The  left ventricle has mildly decreased function. The left ventricle  demonstrates global hypokinesis. The left ventricular internal cavity size  was mildly dilated. There is mild left  ventricular hypertrophy. Left ventricular diastolic parameters are  consistent with Grade II diastolic dysfunction (pseudonormalization).  3. Right ventricular systolic function is normal. The right ventricular  size is normal.  4. The mitral valve is normal in structure. No evidence of mitral valve  regurgitation. No evidence of mitral stenosis.  5. The aortic valve is tricuspid. Aortic valve regurgitation is not  visualized. No aortic stenosis is present.  6. The inferior vena cava is normal in size with greater than 50%  respiratory variability, suggesting right atrial pressure of 3 mmHg.    EKG:  EKG is *** ordered today.  The ekg ordered today demonstrates ***  Recent Labs: 12/30/2019: TSH 1.417 01/04/2020: ALT 71; B Natriuretic Peptide 44.7; Hemoglobin 15.4; Magnesium 2.5; Platelets 387 01/05/2020: BUN 21; Creatinine, Ser 1.17; Potassium 3.5; Sodium 140  Recent Lipid Panel No results found for: CHOL, TRIG, HDL, CHOLHDL, VLDL, LDLCALC, LDLDIRECT   Risk Assessment/Calculations:   {Does this patient have ATRIAL FIBRILLATION?[phone]}   Physical  Exam:    VS:  There were no vitals taken for this visit.    Wt Readings from Last 3 Encounters:  12/31/19 277 lb 8 oz (125.9 kg)  11/23/16 279 lb 12.8 oz (126.9 kg)  07/25/16 282 lb (127.9 kg)     GEN: *** Well nourished, well developed in no acute distress HEENT: Normal NECK: No JVD; No carotid bruits LYMPHATICS: No lymphadenopathy CARDIAC: ***RRR, no murmurs, rubs, gallops RESPIRATORY:  Clear to auscultation without rales, wheezing or rhonchi  ABDOMEN: Soft, non-tender, non-distended MUSCULOSKELETAL:  No edema; No deformity  SKIN: Warm and dry NEUROLOGIC:  Alert and oriented x 3 PSYCHIATRIC:  Normal affect   ASSESSMENT:    No diagnosis found. PLAN:    In order of problems listed above:  #Chronic Systolic HF: Compensated and euvolemic on exam. TTE 2021 with LVEF 40-45% with global hypokinesis,  mild LVH, GIIDD. Cath 2013 without obstructive CAD -Continue BB and ACE-I -Cleda Daub  -SGLT-2  #pAfib  Diagnosed 01/2020 in the setting of COVID infection. CHADs-vasc 2. On eliquis for [facility]  -Continue lopressor and apixaban for AC  #HTN:  #HLD:  #OSA on CPAP:  #Obesity with BMI 38:   {Are you ordering a CV Procedure (e.g. stress test, cath, DCCV, TEE, etc)?   Press F2        :[id number]}    Medication Adjustments/Labs and Tests Ordered: Current medicines are reviewed at length with the patient today.  Concerns regarding medicines are outlined above.  No orders of the defined types were placed in this encounter.  No orders of the defined types were placed in this encounter.   There are no Patient Instructions on file for this visit.   Signed, Meriam Sprague, MD  09/21/2020 1:59 PM    [facility]

## 2020-10-01 ENCOUNTER — Ambulatory Visit: Payer: Self-pay | Admitting: Cardiology

## 2020-10-11 NOTE — Progress Notes (Deleted)
Cardiology Office Note:    Date:  10/11/2020   ID:  Jared Lopez, DOB 1966/04/10, MRN [id number]  PCP:  Jared Mires, Jared Lopez  [facility] Cardiologist:  No primary care provider on file.  [facility] Electrophysiologist:  None   Referring Jared Lopez: Jared Mires, Jared Lopez    History of Present Illness:    Jared Lopez is a 55 y.o. male with a hx of HTN, HLD, obesity, OSA on CPAP, Afib on eliquis, and chronic systolic HF with LVEF 40-45% who was referred by Dr. Loleta Lopez for further management of his systolic heart failure and Afib.   Patient previously followed by Jared Lopez. Had cath in 2013 without obstructive disease. TTE 2021 with LVEF 40-45% with global hypokinesis, mild LVH, GIIDD. Cardiac montior 06/11/20 without significant arrhythmias.   Past Medical History:  Diagnosis Date  . Abdominal pain   . Arthritis   . Blood in stool   . Blurred vision   . Chronic back pain   . Constipation   . Difficulty urinating   . Enlarged prostate   . Generalized headaches    high blood pressure onset  . Hernia   . Hyperlipidemia   . Hypertension   . Mass of perirectal soft tissue   . Over weight   . Rectal bleeding   . Sleep apnea    uses CPAP nightly    Past Surgical History:  Procedure Laterality Date  . BACK SURGERY    . CARDIAC CATHETERIZATION  2013   Dr Jared Lopez  . HERNIA REPAIR    . KNEE ARTHROSCOPY WITH MEDIAL MENISECTOMY Left 11/23/2016   Procedure: KNEE ARTHROSCOPY WITH MEDIAL AND LATERAL MENISECTOMY AND DEBRIEDMENT OF CONDROMALACIA  FEMUR;  Surgeon: Jared Birchwood, Jared Lopez;  Location:  SURGERY CENTER;  Service: Orthopedics;  Laterality: Left;  . LEFT HEART CATHETERIZATION WITH CORONARY ANGIOGRAM N/A 06/12/2012   Procedure: LEFT HEART CATHETERIZATION WITH CORONARY ANGIOGRAM;  Surgeon: Jared Pert, Jared Lopez;  Location: [facility] CATH LAB;  Service: Cardiovascular;  Laterality: N/A;    Current Medications: No outpatient medications have been marked as taking for the 10/14/20 encounter  (Appointment) with Jared Sprague, Jared Lopez.     Allergies:   Patient has no known allergies.   Social History   Socioeconomic History  . Marital status: Married    Spouse name: Not on file  . Number of children: Not on file  . Years of education: Not on file  . Highest education level: Not on file  Occupational History  . Not on file  Tobacco Use  . Smoking status: Current Some Day Smoker    Types: Cigars  . Smokeless tobacco: Never Used  Vaping Use  . Vaping Use: Never used  Substance and Sexual Activity  . Alcohol use: Yes    Alcohol/week: 1.0 - 2.0 standard drink    Types: 1 - 2 Cans of beer per week    Comment: social  . Drug use: No  . Sexual activity: Not on file  Other Topics Concern  . Not on file  Social History Narrative  . Not on file   Social Determinants of Health   Financial Resource Strain: Not on file  Food Insecurity: Not on file  Transportation Needs: Not on file  Physical Activity: Not on file  Stress: Not on file  Social Connections: Not on file     Family History: The patient's ***family history includes Atrial fibrillation in his mother; Heart disease in his mother; Hypertension in his mother.  ROS:   Please see the history of present illness.    *** All other systems reviewed and are negative.  EKGs/Labs/Other Studies Reviewed:    The following studies were reviewed today: Cath 2013: Hemodynamic data:  Left ventricular pressure was 111/4 with LVEDP of 12 mm mercury. Aortic pressure was 105/75 with a mean of 89 mm mercury. There was no pressure gradient across the aortic valve  Left ventricle: Performed in the RAO projection revealed LVEF of 60%. There was No MR. No Wall motion abnormality.  Right coronary artery: The vessel is smooth, normal, Dominant. It is a very large caliber vessel. The PD and PL bifurcation is in the proximal segment.  Left main coronary arteryis large and normal.  Circumflex coronary artery:A large  vessel giving origin to a large obtuse marginal 1.   KYH:CWCB large caliber vessel . LAD gives origin to a large diagonal-1. LAD Normal  Cardiac Monitor 06/11/2020:  Normal sinus rhythm with rare PACs and PVCs.  No atrial fibrillation.  Rare short runs of PVCs (max 4 beats).  Rare short runs of SVT with all episodes less than 10 seconds; none of which correlated to patient symptoms.  No sustained arrhythmias.  TTE 12/31/19: IMPRESSIONS  1. Definity used; mild to moderate global reduction in LV systolic  function; grade 2 diastolic dysfunction; mild LVE; mild LVH.  2. Left ventricular ejection fraction, by estimation, is 40 to 45%. The  left ventricle has mildly decreased function. The left ventricle  demonstrates global hypokinesis. The left ventricular internal cavity size  was mildly dilated. There is mild left  ventricular hypertrophy. Left ventricular diastolic parameters are  consistent with Grade II diastolic dysfunction (pseudonormalization).  3. Right ventricular systolic function is normal. The right ventricular  size is normal.  4. The mitral valve is normal in structure. No evidence of mitral valve  regurgitation. No evidence of mitral stenosis.  5. The aortic valve is tricuspid. Aortic valve regurgitation is not  visualized. No aortic stenosis is present.  6. The inferior vena cava is normal in size with greater than 50%  respiratory variability, suggesting right atrial pressure of 3 mmHg.  EKG:  EKG is *** ordered today.  The ekg ordered today demonstrates ***  Recent Labs: 12/30/2019: TSH 1.417 01/04/2020: ALT 71; B Natriuretic Peptide 44.7; Hemoglobin 15.4; Magnesium 2.5; Platelets 387 01/05/2020: BUN 21; Creatinine, Ser 1.17; Potassium 3.5; Sodium 140  Recent Lipid Panel No results found for: CHOL, TRIG, HDL, CHOLHDL, VLDL, LDLCALC, LDLDIRECT   Risk Assessment/Calculations:   {Does this patient have ATRIAL FIBRILLATION?:[phone]}   Physical  Exam:    VS:  There were no vitals taken for this visit.    Wt Readings from Last 3 Encounters:  12/31/19 277 lb 8 oz (125.9 kg)  11/23/16 279 lb 12.8 oz (126.9 kg)  07/25/16 282 lb (127.9 kg)     GEN: *** Well nourished, well developed in no acute distress HEENT: Normal NECK: No JVD; No carotid bruits LYMPHATICS: No lymphadenopathy CARDIAC: ***RRR, no murmurs, rubs, gallops RESPIRATORY:  Clear to auscultation without rales, wheezing or rhonchi  ABDOMEN: Soft, non-tender, non-distended MUSCULOSKELETAL:  No edema; No deformity  SKIN: Warm and dry NEUROLOGIC:  Alert and oriented x 3 PSYCHIATRIC:  Normal affect   ASSESSMENT:    No diagnosis found. PLAN:    In order of problems listed above:  #Chronic Systolic HF: Compensated and euvolemic on exam. TTE 2021 with LVEF 40-45% with global hypokinesis, mild LVH, GIIDD. Cath 2013 without obstructive CAD -  Continue BB and ACE-I -Cleda Daub  -SGLT-2  #pAfib  Diagnosed 01/2020 in the setting of COVID infection. CHADs-vasc 2. On eliquis for [facility].  -Continue lopressor and apixaban for AC  #HTN:  #HLD:  #OSA on CPAP:  #Obesity with BMI 38:   {Are you ordering a CV Procedure (e.g. stress test, cath, DCCV, TEE, etc)?   Press F2        :[id number]}    Medication Adjustments/Labs and Tests Ordered: Current medicines are reviewed at length with the patient today.  Concerns regarding medicines are outlined above.  No orders of the defined types were placed in this encounter.  No orders of the defined types were placed in this encounter.   There are no Patient Instructions on file for this visit.   Signed, Jared Sprague, Jared Lopez  10/11/2020 12:49 PM    [facility]

## 2020-10-14 ENCOUNTER — Ambulatory Visit: Payer: Self-pay | Admitting: Cardiology

## 2020-10-19 ENCOUNTER — Encounter: Payer: Self-pay | Admitting: General Practice

## 2020-12-31 IMAGING — DX DG CHEST 1V PORT
1 series · 1 of 1 positions shown · non-contrast
Comparison: Chest radiograph 01/01/2020

CLINICAL DATA: Short of breath. Patient is covid postitive

EXAM:
PORTABLE CHEST 1 VIEW

[chest]
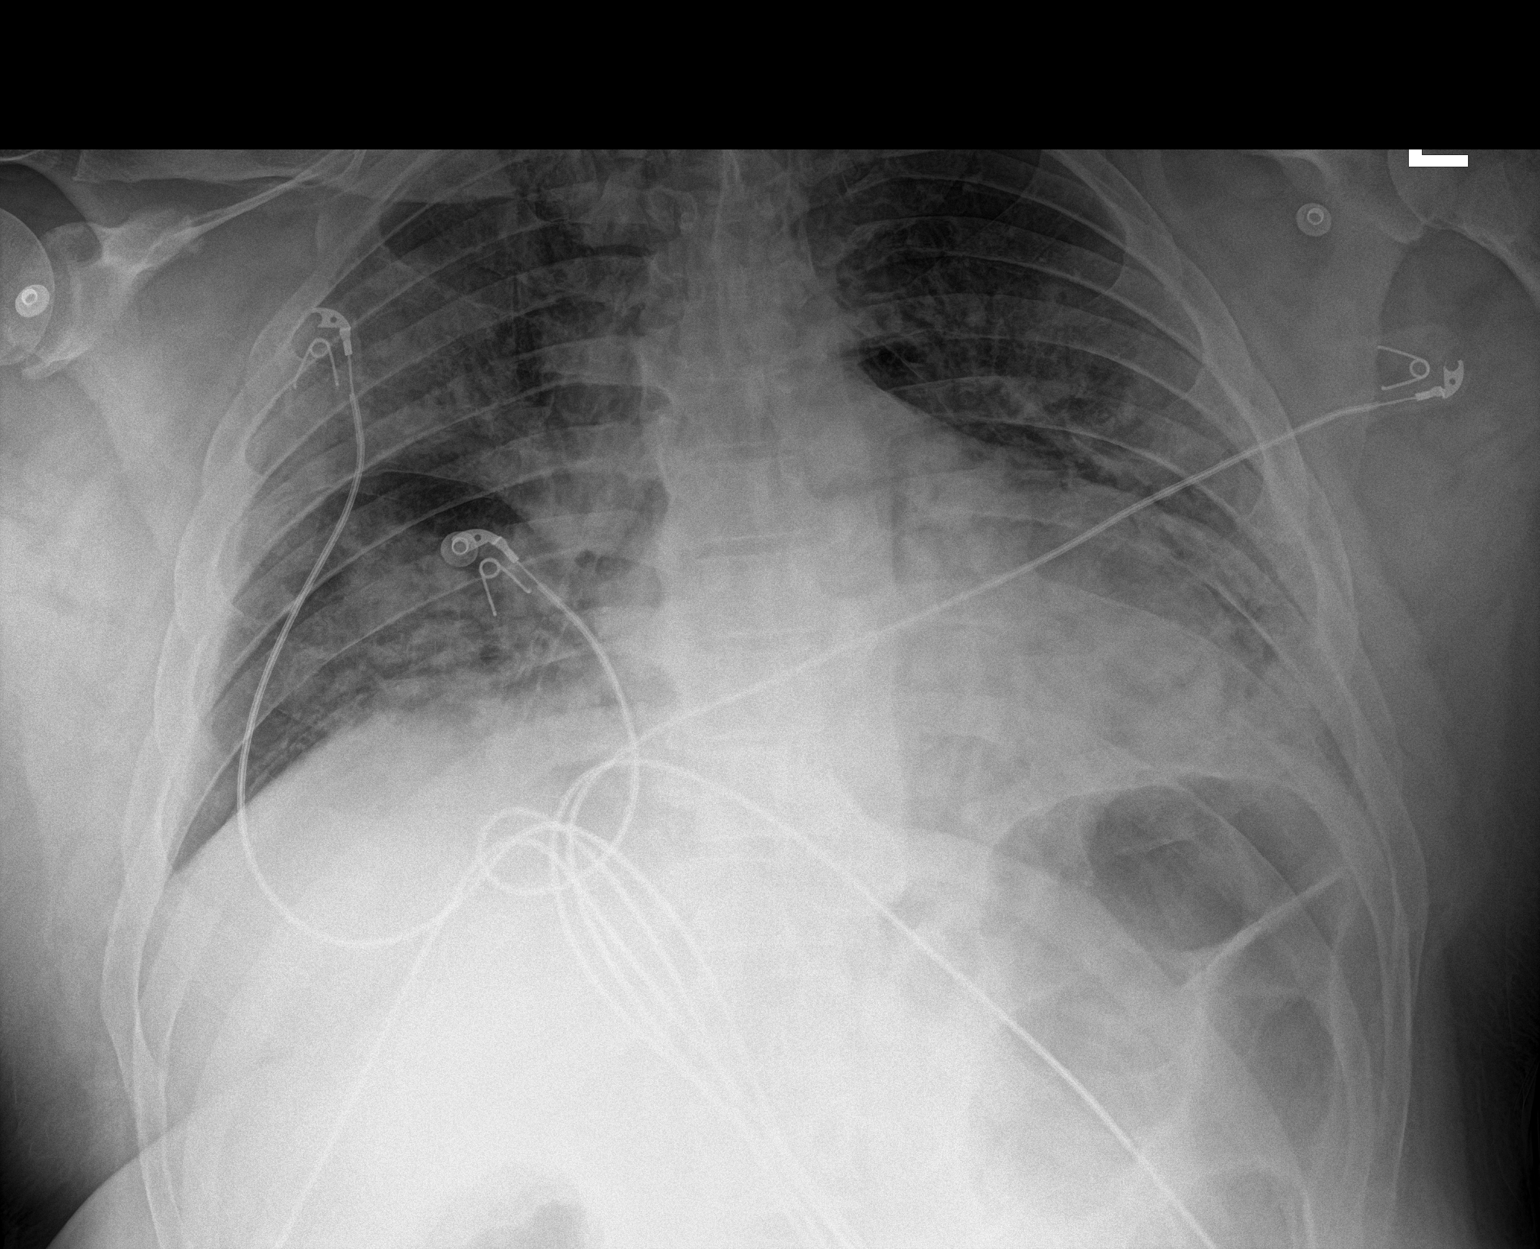

[1 of 1 positions shown; findings below may reference images not displayed]

FINDINGS: Stable cardiomediastinal contours. Low lung volumes. Aeration of the
lungs appears slightly improved bilaterally particularly at the left
base. No evidence of pneumothorax. No significant pleural effusion.

There are some subtle linear lucencies at the neck base, partially
visualized on the current radiograph which may be artifactual.
IMPRESSION: 1. Slightly improved aeration particularly at the left base.
2. Subtle linear lucencies at the neck base may be artifactual.
Consider repeat radiograph with a wider field-of-view to exclude
subcutaneous emphysema.

## 2021-01-02 IMAGING — DX DG CHEST 1V PORT
1 series · 1 of 1 positions shown · non-contrast
Comparison: 01/04/2019

CLINICAL DATA: Shortness of breath

EXAM:
PORTABLE CHEST 1 VIEW

[chest ap]
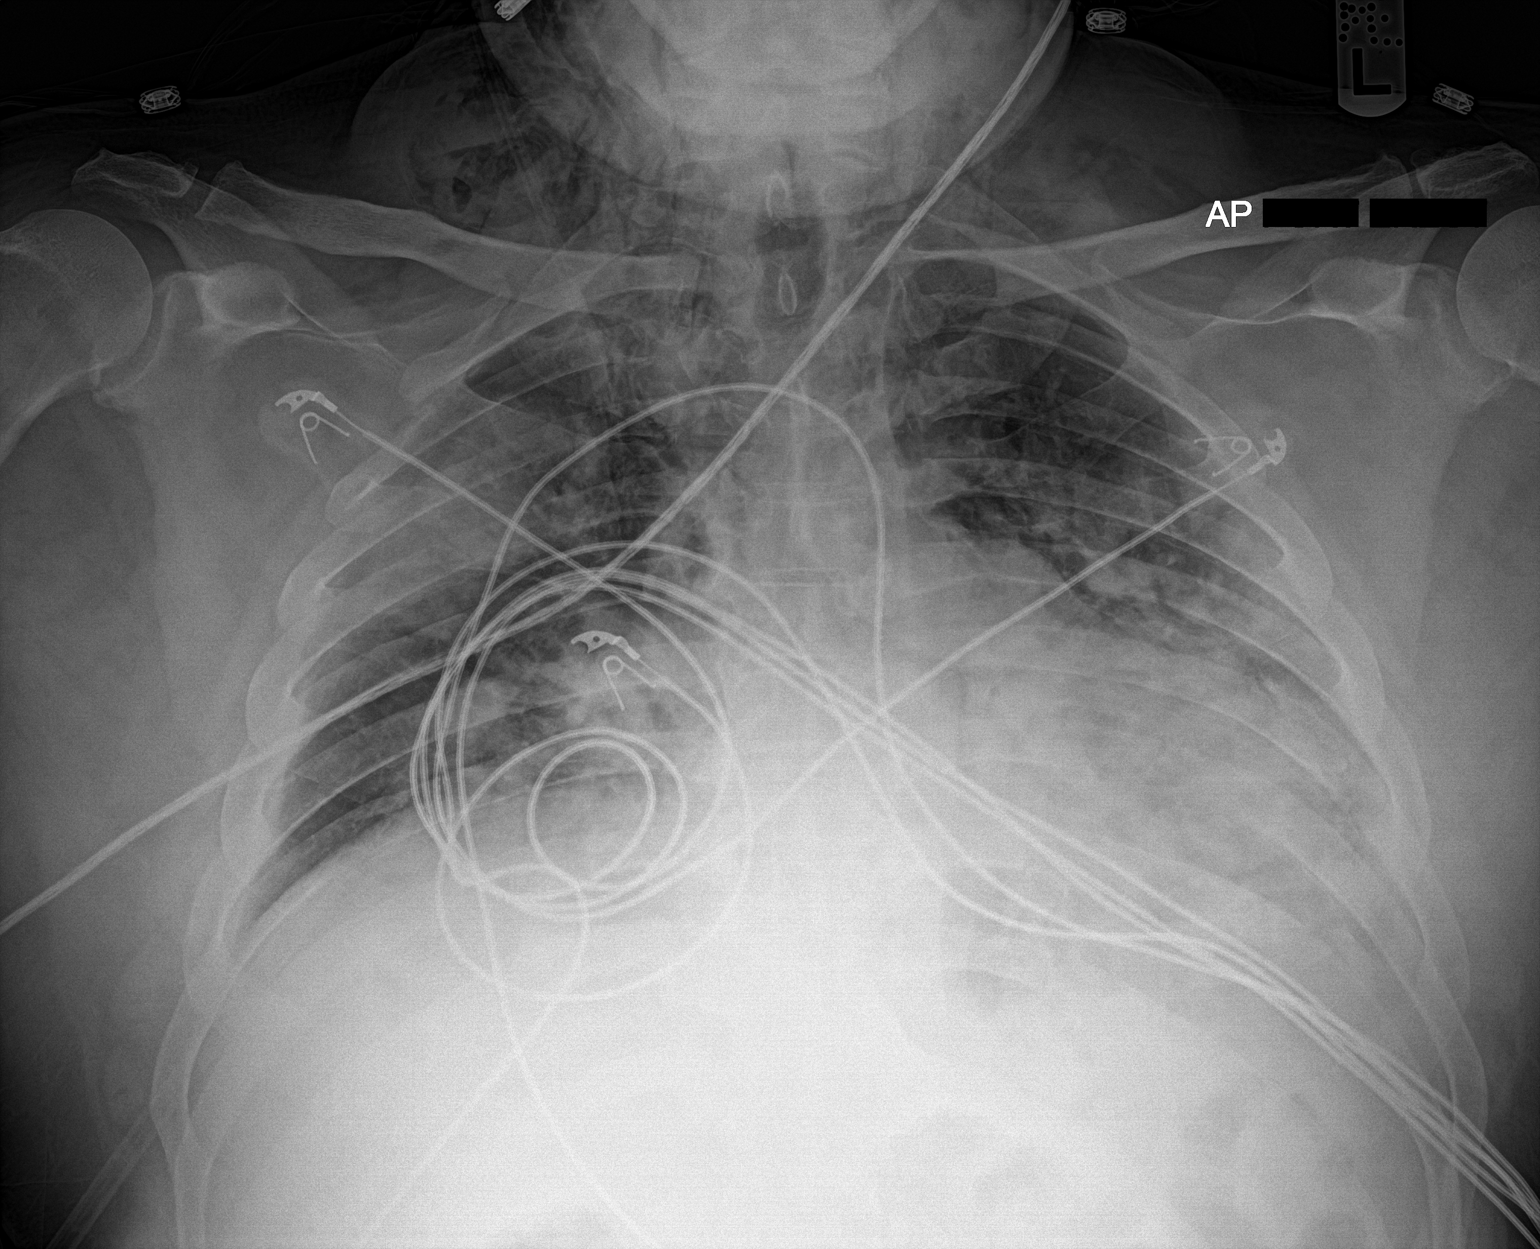

[1 of 1 positions shown; findings below may reference images not displayed]

FINDINGS: Very low lung volumes. Persistent bilateral opacities similar to the
prior study. No large pleural effusion. No definite pneumothorax.
There is subcutaneous emphysema in the neck and possible
pneumomediastinum. Similar cardiomediastinal contours.
IMPRESSION: Persistent subcutaneous emphysema in neck and possible
pneumomediastinum.

Low lung volumes with persistent bilateral opacities similar to the
prior study.

These results will be called to the ordering clinician or
representative by the Radiologist Assistant, and communication
documented in the PACS or [REDACTED].
